# Patient Record
Sex: Female | Born: 1980 | Race: White | Hispanic: No | Marital: Married | Smoking: Never smoker
Health system: Southern US, Community
[De-identification: ages and names within clinical notes are randomized; demographics above are authoritative.]

## PROBLEM LIST (undated history)

## (undated) DIAGNOSIS — J329 Chronic sinusitis, unspecified: Secondary | ICD-10-CM

## (undated) DIAGNOSIS — H519 Unspecified disorder of binocular movement: Secondary | ICD-10-CM

## (undated) DIAGNOSIS — R87629 Unspecified abnormal cytological findings in specimens from vagina: Secondary | ICD-10-CM

## (undated) DIAGNOSIS — N946 Dysmenorrhea, unspecified: Secondary | ICD-10-CM

## (undated) DIAGNOSIS — F329 Major depressive disorder, single episode, unspecified: Secondary | ICD-10-CM

## (undated) DIAGNOSIS — K219 Gastro-esophageal reflux disease without esophagitis: Secondary | ICD-10-CM

## (undated) DIAGNOSIS — J302 Other seasonal allergic rhinitis: Secondary | ICD-10-CM

## (undated) DIAGNOSIS — IMO0002 Reserved for concepts with insufficient information to code with codable children: Secondary | ICD-10-CM

## (undated) DIAGNOSIS — F32A Depression, unspecified: Secondary | ICD-10-CM

## (undated) DIAGNOSIS — F419 Anxiety disorder, unspecified: Secondary | ICD-10-CM

## (undated) DIAGNOSIS — R569 Unspecified convulsions: Secondary | ICD-10-CM

## (undated) DIAGNOSIS — Z9049 Acquired absence of other specified parts of digestive tract: Secondary | ICD-10-CM

## (undated) DIAGNOSIS — R87619 Unspecified abnormal cytological findings in specimens from cervix uteri: Secondary | ICD-10-CM

## (undated) DIAGNOSIS — B977 Papillomavirus as the cause of diseases classified elsewhere: Secondary | ICD-10-CM

## (undated) DIAGNOSIS — R112 Nausea with vomiting, unspecified: Secondary | ICD-10-CM

## (undated) DIAGNOSIS — R87610 Atypical squamous cells of undetermined significance on cytologic smear of cervix (ASC-US): Secondary | ICD-10-CM

## (undated) DIAGNOSIS — A630 Anogenital (venereal) warts: Secondary | ICD-10-CM

## (undated) DIAGNOSIS — Z9889 Other specified postprocedural states: Secondary | ICD-10-CM

## (undated) DIAGNOSIS — N920 Excessive and frequent menstruation with regular cycle: Secondary | ICD-10-CM

## (undated) DIAGNOSIS — K8020 Calculus of gallbladder without cholecystitis without obstruction: Secondary | ICD-10-CM

## (undated) DIAGNOSIS — L0232 Furuncle of buttock: Principal | ICD-10-CM

## (undated) HISTORY — DX: Unspecified abnormal cytological findings in specimens from vagina: R87.629

## (undated) HISTORY — DX: Dysmenorrhea, unspecified: N94.6

## (undated) HISTORY — DX: Unspecified abnormal cytological findings in specimens from cervix uteri: R87.619

## (undated) HISTORY — PX: LEEP: SHX91

## (undated) HISTORY — DX: Papillomavirus as the cause of diseases classified elsewhere: B97.7

## (undated) HISTORY — DX: Calculus of gallbladder without cholecystitis without obstruction: K80.20

## (undated) HISTORY — PX: EYE MUSCLE SURGERY: SHX370

## (undated) HISTORY — DX: Excessive and frequent menstruation with regular cycle: N92.0

## (undated) HISTORY — DX: Other seasonal allergic rhinitis: J30.2

## (undated) HISTORY — DX: Reserved for concepts with insufficient information to code with codable children: IMO0002

## (undated) HISTORY — DX: Chronic sinusitis, unspecified: J32.9

## (undated) HISTORY — DX: Atypical squamous cells of undetermined significance on cytologic smear of cervix (ASC-US): R87.610

## (undated) HISTORY — PX: TYMPANOPLASTY: SHX33

## (undated) HISTORY — DX: Furuncle of buttock: L02.32

---

## 1998-07-24 ENCOUNTER — Other Ambulatory Visit: Admission: RE | Admit: 1998-07-24 | Discharge: 1998-07-24 | Payer: Self-pay | Admitting: Obstetrics and Gynecology

## 1999-08-10 HISTORY — PX: LEEP: SHX91

## 1999-11-23 ENCOUNTER — Other Ambulatory Visit: Admission: RE | Admit: 1999-11-23 | Discharge: 1999-11-23 | Payer: Self-pay | Admitting: Obstetrics and Gynecology

## 2000-02-18 ENCOUNTER — Other Ambulatory Visit: Admission: RE | Admit: 2000-02-18 | Discharge: 2000-02-18 | Payer: Self-pay | Admitting: Obstetrics and Gynecology

## 2000-04-12 ENCOUNTER — Other Ambulatory Visit: Admission: RE | Admit: 2000-04-12 | Discharge: 2000-04-12 | Payer: Self-pay | Admitting: Obstetrics and Gynecology

## 2000-04-12 ENCOUNTER — Encounter (INDEPENDENT_AMBULATORY_CARE_PROVIDER_SITE_OTHER): Payer: Self-pay | Admitting: Specialist

## 2000-06-27 ENCOUNTER — Other Ambulatory Visit: Admission: RE | Admit: 2000-06-27 | Discharge: 2000-06-27 | Payer: Self-pay | Admitting: Obstetrics and Gynecology

## 2000-06-27 ENCOUNTER — Encounter (INDEPENDENT_AMBULATORY_CARE_PROVIDER_SITE_OTHER): Payer: Self-pay | Admitting: Specialist

## 2001-10-24 ENCOUNTER — Emergency Department (HOSPITAL_COMMUNITY): Admission: EM | Admit: 2001-10-24 | Discharge: 2001-10-24 | Payer: Self-pay | Admitting: *Deleted

## 2007-06-02 ENCOUNTER — Other Ambulatory Visit: Admission: RE | Admit: 2007-06-02 | Discharge: 2007-06-02 | Payer: Self-pay | Admitting: Obstetrics & Gynecology

## 2007-12-31 ENCOUNTER — Inpatient Hospital Stay (HOSPITAL_COMMUNITY): Admission: AD | Admit: 2007-12-31 | Discharge: 2008-01-03 | Payer: Self-pay | Admitting: Obstetrics and Gynecology

## 2009-08-09 HISTORY — PX: NASAL SEPTUM SURGERY: SHX37

## 2010-04-15 ENCOUNTER — Other Ambulatory Visit
Admission: RE | Admit: 2010-04-15 | Discharge: 2010-04-15 | Payer: Self-pay | Source: Home / Self Care | Admitting: Obstetrics and Gynecology

## 2010-05-25 LAB — HIV ANTIBODY (ROUTINE TESTING W REFLEX): HIV: NONREACTIVE

## 2010-05-25 LAB — RPR: RPR: NONREACTIVE

## 2010-08-09 DIAGNOSIS — Z9049 Acquired absence of other specified parts of digestive tract: Secondary | ICD-10-CM

## 2010-08-09 HISTORY — DX: Acquired absence of other specified parts of digestive tract: Z90.49

## 2010-08-09 HISTORY — PX: CHOLECYSTECTOMY: SHX55

## 2010-08-09 NOTE — L&D Delivery Note (Signed)
Delivery Note At 8:12 PM a viable female was delivered via Vaginal, Spontaneous Delivery (Presentation: Right Occiput Anterior).  APGAR: 8, 9; weight 9 lb 11 oz (4394 g).   Placenta status: Intact, Spontaneous.  Cord: 3 vessels with the following complications: None.  Cord blood donation sent. Uterine massage with good homeostasis.   Anesthesia: Epidural  Episiotomy: None Lacerations: 2nd degree;Perineal Suture Repair: 3.0 vicryl Est. Blood Loss (mL): 300  Mom to postpartum.  Baby to nursery-stable.  Deborah Barr 03/01/2011, 8:47 PM

## 2010-08-31 LAB — CBC
HCT: 35.4 % — ABNORMAL LOW (ref 36.0–46.0)
MCHC: 34.7 g/dL (ref 30.0–36.0)
MCV: 84.3 fL (ref 78.0–100.0)
RDW: 13.2 % (ref 11.5–15.5)
WBC: 10.3 10*3/uL (ref 4.0–10.5)

## 2010-08-31 LAB — BASIC METABOLIC PANEL
BUN: 6 mg/dL (ref 6–23)
GFR calc non Af Amer: >60 mL/min (ref >60)
Glucose, Bld: 80 mg/dL (ref 70–99)
Potassium: 4.3 mEq/L (ref 3.5–5.1)

## 2010-09-04 ENCOUNTER — Ambulatory Visit (HOSPITAL_COMMUNITY)
Admission: RE | Admit: 2010-09-04 | Discharge: 2010-09-05 | Payer: Self-pay | Source: Home / Self Care | Attending: General Surgery | Admitting: General Surgery

## 2010-09-04 HISTORY — PX: LAPAROSCOPIC CHOLECYSTECTOMY: SUR755

## 2010-09-08 NOTE — Discharge Summary (Signed)
  NAME:  Deborah Barr, Deborah Barr NO.:  [id number]  MEDICAL RECORD NO.:  [id number]          PATIENT TYPE:  OIB  LOCATION:  A331                          FACILITY:  [facility]  PHYSICIAN:  Dalia Heading, M.D.  DATE OF BIRTH:  11-20-1980  DATE OF ADMISSION:  09/04/2010 DATE OF DISCHARGE:  01/28/2012LH                              DISCHARGE SUMMARY   HOSPITAL COURSE SUMMARY:  The patient is a 30 year old pregnant white female who was referred by Dr. Despina Hidden to Dr. Leticia Penna for laparoscopic cholecystectomy.  She is approximately 14 or 15 weeks into her pregnancy.  She was taken to the operating room on September 04, 2010, and underwent laparoscopic cholecystectomy.  Postoperatively, she did have some vaginal bleeding.  An ultrasound of the uterus did reveal a viable intrauterine pregnancy without evidence of miscarriage.  She had frequent fetal heart tone monitorings which remained stable.  Dr. Emelda Fear was consulted and he suggested bedrest and observation.  Her vaginal bleeding subsequently resolved and her fetal heart tones on the day of discharge are at 155.  She is being discharged home on postoperative day #1 in good improving condition.  DISCHARGE INSTRUCTIONS:  The patient is to follow up Dr. Leticia Penna on September 10, 2010, Dr. Despina Hidden as previously scheduled.  DISCHARGE MEDICATIONS: 1. Percocet 1-2 tablets p.o. q.4 h. p.r.n. pain. 2. DHA supplement by mouth daily. 3. Zofran 4 mg p.o. q.8 h. p.r.n. 4. Prenatal vitamin 1 tablet p.o. daily.  PRINCIPAL DIAGNOSES: 1. Cholecystitis, cholelithiasis. 2. Intrauterine pregnancy  PRINCIPAL PROCEDURE:  Laparoscopic cholecystectomy on September 04, 2010.     Dalia Heading, M.D.     MAJ/MEDQ  D:  09/05/2010  T:  09/06/2010  Job:  [id number]  cc:   Tilford Pillar, MD Fax: [phone]  Lazaro Arms, M.D. Fax: [phone]  Electronically Signed by Franky Macho M.D. on 09/08/2010 09:16:47 AM

## 2010-09-23 NOTE — Op Note (Signed)
NAMESHAYLEY, MEDLIN NO.:  [id number]  MEDICAL RECORD NO.:  [id number]          PATIENT TYPE:  OIB  LOCATION:  A331                          FACILITY:  [facility]  PHYSICIAN:  Tilford Pillar, MD      DATE OF BIRTH:  [birth year]  DATE OF PROCEDURE:  09/04/2010 DATE OF DISCHARGE:                              OPERATIVE REPORT   PREOPERATIVE DIAGNOSIS:  Cholelithiasis.  POSTOPERATIVE DIAGNOSIS:  Cholelithiasis.  PROCEDURE:  Laparoscopic cholecystectomy.  SURGEON:  Tilford Pillar, MD  ANESTHESIA:  General endotracheal local anesthetic with 0.5% Sensorcaine plain.  SPECIMEN:  Gallbladder.  ESTIMATED BLOOD LOSS:  Minimal.  INDICATIONS:  The patient is a 30 year old female who presented to my office on referral by her OB/GYN, Dr. Despina Hidden, for evaluation of cholelithiasis and biliary disease, at this time, she was 29 weeks' pregnant, currently she is 14 and 1/2 weeks intrauterine pregnancy.  The risks, benefits, and alternatives of laparoscopic possible open cholecystectomy were discussed at length with the patient including but not limited to risk of bleeding, infection, bile leak, small bowel injury, bile duct injury as well as the possibility of intraoperative cardiac and pulmonary events as well as possibility of pregnancy loss, although extremely rare.  Her questions and concerns were addressed. The patient was consented for planned procedure.  OPERATION:  The patient was taken to the operating room and was placed in the supine position on the operating room table, at which time general anesthetic was administered.  Once the patient was asleep, she was endotracheally intubated by Anesthesia.  At this point, her abdomen was prepped with DuraPrep solution and draped in standard fashion.  A stab incision was created supraumbilically with 11-blade scalpel. Additional dissection down through the subcuticular tissue was carried out using a Kocher clamp, which was  utilized to grasp the anterior abdominal wall fascia and palpation of the abdominal wall was carried out and ensured that the uterus was well above the umbilicus which was then palpated.  Uterus was palpated at the upper aspect of the suprapubic region.  At this point, a Veress needle was carefully inserted.  Saline drop test utilized to confirm intraperitoneal placement and then pneumoperitoneum was obtained.  Once sufficient pneumoperitoneum was obtained, an 11-mm trocar was inserted over laparoscope allowing visualization of trocar entering into peritoneal cavity.  At this time, the inner cannula was removed.  The laparoscope was reinserted.  No evidence of any trocar or Veress needle placement injury.  At this time, the remaining trocars were placed.  A 11-mm trocar was placed in the epigastrium, a 5-mm trocar was placed in the midline between the two 11-mm trocars and the 5-mm trocars placed in the right lateral abdominal wall.  the patient was placed in a reverse Trendelenburg left lateral decubitus position.  The fundus of the gallbladder was grasped and lifted up and over the right lobe of the liver.  A blunt dissection was carried out with a [place] dissector and the  infundibulum exposing both the cystic duct and cystic artery, as they entered into the infundibulum.  A window was noted behind both these structures and clips were  placed proximally on both sides and distally and the cystic duct was divided between the two most distal clips.  Similarly, the cystic artery.  At this time, electrocautery was utilized to dissect the gallbladder free from the gallbladder fossa.  A small cholecystotomy was created.  A small amount of bile spillage at time, it grasped with a regular graspers to control spillage.  We dissected off the gallbladder fossa, placed in EndoCatch bag which placed up over the right lobe liver.  Suction irrigator was brought to the field.  The surgical site was  copiously irrigated, returning aspirate was clear, endoscope was inspected.  There was no evidence of any bleeding or bile leak.  At this time, I did inspect the remainder of the abdomen and again the uterus was inspected and noted to be clearly gravid and unremarkable.  At this time I did turn attention to closure.  Using an EndoClose suture passing device a 2-0 Vicryl suture was passed through both the 11-mm trocar sites.  With these sutures placed in place, the pneumoperitoneum evacuated.  Trocars were removed.  The local anesthetic instilled, Vicryl sutures secured, t4-0 Monocryl was utilized to reapproximate skin edges at all trocar sites and the skin was washed and dried with moist dry towel.  Benzoin was applied around the incision.  Half-inch Steri-Strips were placed.  The drapes were removed. The patient was allowed to come out of the general anesthetic and was transferred back to regular hospital bed in stable condition.  At the conclusion of procedure, all instruments, sponge, and needle counts were correct.  The patient tolerated the procedure well.     Tilford Pillar, MD     BZ/MEDQ  D:  09/04/2010  T:  09/05/2010  Job:  [id number]  Electronically Signed by Tilford Pillar MD on 09/23/2010 11:09:55 AM

## 2010-09-23 NOTE — H&P (Signed)
NAME:  DANN, VENTRESS NO.:  [id number]  MEDICAL RECORD NO.:  [id number]          PATIENT TYPE:  AMB  LOCATION:  DAY                           FACILITY:  [facility]  PHYSICIAN:  Tilford Pillar, MD      DATE OF BIRTH:  October 15, 1980  DATE OF ADMISSION: DATE OF DISCHARGE:  LH                             HISTORY & PHYSICAL   CHIEF COMPLAINT:  Gallstones.  HISTORY OF PRESENT ILLNESS:  The patient is a 30 year old female with a history of epigastric and right upper quadrant abdominal pain.  This started after Thanksgiving of this year.  She has had a couple of episodes over the Christmas holiday and states that the pain used to occasionally come on with drinking water.  She has not noticed any change with fatty or greasy food intake.  No history of jaundice. Occasional nausea.  No emesis.  No change in bowel movements.  No melena, no hematochezia.  Unfortunately, the patient's symptomatology has progressed with some mild exacerbations of her discomfort.  She is currently in her 13th week of her pregnancy and has been evaluated and seen by her OB/GYN, Dr. Despina Hidden, who has recommended proceeding with laparoscopic cholecystectomy in her 14th or 15th week of her pregnancy.  PAST MEDICAL HISTORY:  None.  PAST SURGICAL HISTORY:  Previous eye surgery and sinus surgery.  MEDICATIONS:  Pravastatin and Zofran.  ALLERGIES:  Codeine.  SOCIAL HISTORY:  No tobacco, no alcohol use.  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  She has had three prior pregnancies, this is her fourth.  FAMILY HISTORY:  Does have a history of biliary disease.  REVIEW OF SYSTEMS:  CONSTITUTIONAL:  Unremarkable.  EYES:  Unremarkable. EARS, NOSE, AND THROAT:  Occasional rhinorrhea.  RESPIRATORY: Unremarkable.  CARDIOVASCULAR:  Unremarkable.  GASTROINTESTINAL: Abdominal pain, nausea, and indigestion as per HPI.  GENITOURINARY: Increased frequency suspected to be likely related to  pregnancy. MUSCULOSKELETAL:  Unremarkable.  SKIN:  Dry.  ENDOCRINE:  Unremarkable. NEUROLOGIC:  Unremarkable.  PHYSICAL EXAMINATION:  GENERAL:  The patient is a healthy, mildly to moderately obese female.  She is calm.  She is not in acute distress. She is alert and oriented x3. HEENT:  Scalp, no deformities or masses.  Eyes, pupils equal, round, reactive.  Extraocular movement are intact.  No scleral icterus, conjunctival pallor.  Oral mucosa pink, normal occlusion. NECK:  Trachea is midline.  No thyroid nodularity is noted.  No cervical lymphadenopathy. PULMONARY:  Unlabored respirations.  No wheezes.  No crackles.  She is clear to auscultation bilaterally. CARDIOVASCULAR:  Regular rate and rhythm.  No murmurs or gallops.  She has 2+ radial and femoral pulses bilaterally. ABDOMEN:  Positive bowel sounds.  Abdomen is soft and nontender.  I am not able to palpate the fundus of the uterus at this point due to the early course of her pregnancy. SKIN:  Warm and dry.  PERTINENT LABORATORY AND RADIOGRAPHIC STUDIES:  Right upper quadrant ultrasound did demonstrate stones within the gallbladder.  ASSESSMENT AND PLAN:  Cholelithiasis.  At this time, I had an extremely long conversation with the patient regarding the findings  of the possibility of an acute exacerbation of her symptomatology during the pregnancy and elective surgery versus continued monitoring.  I also discussed risks, benefits, and alternatives of possible laparoscopic surgery at the time of her pregnancy, although extremely remote, she is aware that there is a possibility of loss of pregnancy secondary to a surgical intervention.  However, she is also aware that due to her several exacerbations of her cholelithiasis that additional exacerbations are extremely likely during the course of her pregnancy. As recommended by her OB/GYN, she does understand and does wish to proceed with the surgical intervention again in the 14th  or 15th week of her pregnancy.  We will plan to proceed.     Tilford Pillar, MD     BZ/MEDQ  D:  09/03/2010  T:  09/03/2010  Job:  [id number]  Electronically Signed by Tilford Pillar MD on 09/23/2010 11:09:52 AM

## 2010-11-15 DIAGNOSIS — Z9049 Acquired absence of other specified parts of digestive tract: Secondary | ICD-10-CM

## 2010-11-16 ENCOUNTER — Encounter: Payer: Self-pay | Admitting: Obstetrics & Gynecology

## 2011-03-01 ENCOUNTER — Encounter (HOSPITAL_COMMUNITY): Payer: Self-pay | Admitting: Anesthesiology

## 2011-03-01 ENCOUNTER — Inpatient Hospital Stay (HOSPITAL_COMMUNITY): Payer: BC Managed Care – PPO | Admitting: Anesthesiology

## 2011-03-01 ENCOUNTER — Inpatient Hospital Stay (HOSPITAL_COMMUNITY)
Admission: AD | Admit: 2011-03-01 | Discharge: 2011-03-03 | DRG: 373 | Disposition: A | Discharge status: Home / Self Care | Payer: BC Managed Care – PPO | Source: Ambulatory Visit | Attending: Obstetrics & Gynecology | Admitting: Obstetrics & Gynecology

## 2011-03-01 ENCOUNTER — Encounter (HOSPITAL_COMMUNITY): Payer: Self-pay

## 2011-03-01 DIAGNOSIS — IMO0001 Reserved for inherently not codable concepts without codable children: Secondary | ICD-10-CM

## 2011-03-01 HISTORY — DX: Acquired absence of other specified parts of digestive tract: Z90.49

## 2011-03-01 HISTORY — DX: Anogenital (venereal) warts: A63.0

## 2011-03-01 HISTORY — DX: Unspecified disorder of binocular movement: H51.9

## 2011-03-01 LAB — CBC
MCH: 29.1 pg (ref 26.0–34.0)
MCHC: 33.2 g/dL (ref 30.0–36.0)
Platelets: 152 10*3/uL (ref 150–400)

## 2011-03-01 MED ORDER — IBUPROFEN 600 MG PO TABS
600.0000 mg | ORAL_TABLET | Freq: Four times a day (QID) | ORAL | Status: DC
  Administered 2011-03-02 – 2011-03-03 (×5): 600 mg via ORAL
  Filled 2011-03-01 (×5): qty 1

## 2011-03-01 MED ORDER — ONDANSETRON HCL 4 MG PO TABS: 4.0000 mg | ORAL_TABLET | ORAL | Status: DC | PRN

## 2011-03-01 MED ORDER — DIPHENHYDRAMINE HCL 25 MG PO CAPS: 25.0000 mg | ORAL_CAPSULE | Freq: Four times a day (QID) | ORAL | Status: DC | PRN

## 2011-03-01 MED ORDER — ONDANSETRON HCL 4 MG/2ML IJ SOLN: 4.0000 mg | INTRAMUSCULAR | Status: DC | PRN

## 2011-03-01 MED ORDER — PRENATAL PLUS 27-1 MG PO TABS
1.0000 | ORAL_TABLET | Freq: Every day | ORAL | Status: DC
  Administered 2011-03-02 – 2011-03-03 (×2): 1 via ORAL
  Filled 2011-03-01 (×2): qty 1

## 2011-03-01 MED ORDER — NALBUPHINE SYRINGE 5 MG/0.5 ML
10.0000 mg | INJECTION | Freq: Once | INTRAMUSCULAR | Status: DC
  Filled 2011-03-01: qty 1

## 2011-03-01 MED ORDER — TRAMADOL HCL 50 MG PO TABS
50.0000 mg | ORAL_TABLET | ORAL | Status: DC | PRN
  Filled 2011-03-01: qty 1

## 2011-03-01 MED ORDER — WITCH HAZEL-GLYCERIN EX PADS: MEDICATED_PAD | CUTANEOUS | Status: DC | PRN

## 2011-03-01 MED ORDER — LACTATED RINGERS IV SOLN
500.0000 mL | Freq: Once | INTRAVENOUS | Status: DC
Start: 2011-03-01 — End: 2011-03-01

## 2011-03-01 MED ORDER — SIMETHICONE 80 MG PO CHEW: 80.0000 mg | CHEWABLE_TABLET | ORAL | Status: DC | PRN

## 2011-03-01 MED ORDER — FENTANYL CITRATE 0.05 MG/ML IJ SOLN: 100.0000 ug | INTRAMUSCULAR | Status: DC | PRN

## 2011-03-01 MED ORDER — NALBUPHINE SYRINGE 5 MG/0.5 ML
5.0000 mg | INJECTION | INTRAMUSCULAR | Status: DC | PRN
  Filled 2011-03-01: qty 0.5

## 2011-03-01 MED ORDER — ACETAMINOPHEN 325 MG PO TABS
650.0000 mg | ORAL_TABLET | ORAL | Status: DC | PRN
Start: 2011-03-01 — End: 2011-03-01

## 2011-03-01 MED ORDER — DIPHENHYDRAMINE HCL 50 MG/ML IJ SOLN: 12.5000 mg | INTRAMUSCULAR | Status: DC | PRN

## 2011-03-01 MED ORDER — ZOLPIDEM TARTRATE 5 MG PO TABS: 5.0000 mg | ORAL_TABLET | Freq: Every evening | ORAL | Status: DC | PRN

## 2011-03-01 MED ORDER — EPHEDRINE 5 MG/ML INJ
10.0000 mg | INTRAVENOUS | Status: DC | PRN
  Filled 2011-03-01 (×2): qty 4

## 2011-03-01 MED ORDER — LACTATED RINGERS IV SOLN: INTRAVENOUS | Status: DC

## 2011-03-01 MED ORDER — LANOLIN HYDROUS EX OINT: TOPICAL_OINTMENT | CUTANEOUS | Status: DC | PRN

## 2011-03-01 MED ORDER — ONDANSETRON HCL 4 MG/2ML IJ SOLN: 4.0000 mg | Freq: Four times a day (QID) | INTRAMUSCULAR | Status: DC | PRN

## 2011-03-01 MED ORDER — IBUPROFEN 600 MG PO TABS
600.0000 mg | ORAL_TABLET | Freq: Four times a day (QID) | ORAL | Status: DC | PRN
Start: 2011-03-01 — End: 2011-03-01

## 2011-03-01 MED ORDER — PHENYLEPHRINE 40 MCG/ML (10ML) SYRINGE FOR IV PUSH (FOR BLOOD PRESSURE SUPPORT)
80.0000 ug | PREFILLED_SYRINGE | INTRAVENOUS | Status: DC | PRN
  Filled 2011-03-01 (×2): qty 5

## 2011-03-01 MED ORDER — EPHEDRINE 5 MG/ML INJ
10.0000 mg | INTRAVENOUS | Status: DC | PRN
  Filled 2011-03-01: qty 4

## 2011-03-01 MED ORDER — TETANUS-DIPHTH-ACELL PERTUSSIS 5-2.5-18.5 LF-MCG/0.5 IM SUSP: 0.5000 mL | Freq: Once | INTRAMUSCULAR | Status: DC

## 2011-03-01 MED ORDER — PHENYLEPHRINE 40 MCG/ML (10ML) SYRINGE FOR IV PUSH (FOR BLOOD PRESSURE SUPPORT)
80.0000 ug | PREFILLED_SYRINGE | INTRAVENOUS | Status: DC | PRN
  Filled 2011-03-01: qty 5

## 2011-03-01 MED ORDER — LIDOCAINE HCL (PF) 1 % IJ SOLN
30.0000 mL | INTRAMUSCULAR | Status: DC | PRN
  Administered 2011-03-01: 30 mL via SUBCUTANEOUS
  Filled 2011-03-01: qty 30

## 2011-03-01 MED ORDER — OXYTOCIN 20 UNITS IN LACTATED RINGERS INFUSION - SIMPLE
125.0000 mL/h | Freq: Once | INTRAVENOUS | Status: AC
  Administered 2011-03-01: 125 mL/h via INTRAVENOUS
  Filled 2011-03-01: qty 1000

## 2011-03-01 MED ORDER — LACTATED RINGERS IV SOLN: 500.0000 mL | INTRAVENOUS | Status: DC | PRN

## 2011-03-01 MED ORDER — FLEET ENEMA 7-19 GM/118ML RE ENEM
1.0000 | ENEMA | RECTAL | Status: DC | PRN
Start: 2011-03-01 — End: 2011-03-01

## 2011-03-01 MED ORDER — HYDROCORTISONE ACE-PRAMOXINE 1-1 % RE FOAM: 1.0000 | Freq: Two times a day (BID) | RECTAL | Status: DC

## 2011-03-01 MED ORDER — LIDOCAINE HCL 1.5 % IJ SOLN
INTRAMUSCULAR | Status: DC | PRN
  Administered 2011-03-01: 2 mL
  Administered 2011-03-01 (×2): 5 mL

## 2011-03-01 MED ORDER — BENZOCAINE-MENTHOL 20-0.5 % EX AERO: 1.0000 "application " | INHALATION_SPRAY | CUTANEOUS | Status: DC | PRN

## 2011-03-01 MED ORDER — FENTANYL 2.5 MCG/ML BUPIVACAINE 1/10 % EPIDURAL INFUSION (WH - ANES)
14.0000 mL/h | INTRAMUSCULAR | Status: DC
  Administered 2011-03-01: 14 mL/h via EPIDURAL
  Filled 2011-03-01: qty 60

## 2011-03-01 MED ORDER — SENNOSIDES-DOCUSATE SODIUM 8.6-50 MG PO TABS
1.0000 | ORAL_TABLET | Freq: Every day | ORAL | Status: DC
  Administered 2011-03-02: 1 via ORAL

## 2011-03-01 MED ORDER — TRAMADOL HCL 50 MG PO TABS: 50.0000 mg | ORAL_TABLET | ORAL | Status: DC | PRN

## 2011-03-01 MED ORDER — CITRIC ACID-SODIUM CITRATE 334-500 MG/5ML PO SOLN: 30.0000 mL | ORAL | Status: DC | PRN

## 2011-03-01 NOTE — Progress Notes (Signed)
PT WALKING UNTIL 1700

## 2011-03-01 NOTE — Progress Notes (Signed)
Pt states ctx's q38minutes apart since 0600 this am, denies bleding or lof, +FM.

## 2011-03-01 NOTE — ED Provider Notes (Signed)
     History   Chief Complaint:  Contractions  Deborah Barr is  31 y.o. G3P1011 Patient's last menstrual period was 05/25/2010. EDD is 03/01/11. 40 wga. She reports contractions that started at 6am today. She reports having them every . They then increased in frequency and intensity. She denies any vaginal bleeding, any leakage of fluid or discharge. Her pregnancy has been complicated by cholecystitis for which she had a lap cholecystectomy at 16wga. She denies any HTN or DM  Deborah Barr is  30 y.o. G3P1011 Patient's last menstrual period was 05/25/2010.Marland Kitchen  General ROS:  negative except per HPI    Physical Exam   Blood pressure 100/58, pulse 81, temperature 98 F (36.7 C), temperature source Oral, resp. rate 16, height 5\' 2"  (1.575 m), weight 209 lb 4 oz (94.915 kg), last menstrual period 05/25/2010.  General exam: Gen: A+Ox3, in discomfort during contractions. CV: RRR, no murmurs, rubs or gallops Lungs: CTA b/l Abdomen: gravid Ext: no edema Focused Gynecological Exam: SVE: 1.5/70/-2 SVE 1.5hr later: 6.5/90/-1 NST: reactive. FHR: 140s, moderate variability, present accelerations, no decels.   Labs: No results found for this or any previous visit (from the past 24 hour(s)).  Ultrasound Studies: (NOTE: Ultrasound reports are not yet able to be directly imported into notes.  Please "CUT" this portion of the note and either free text the report or "COPY/PASTE" from the report viewer.)  Assessment: Evaluation for labor.    Plan: 1. Will check pt after an hour of walking to assess any cervical change.  2. Pt in active labor. Admit to [facility]   Deborah Barr 03/01/2011, 4:00 PM

## 2011-03-01 NOTE — Progress Notes (Addendum)
  Deborah Barr is a 30 y.o. G3P1011 at 40wga who presents in active labor.  Subjective: Pt received epidural. AROM with light meconium. Pt comfortable.   Objective: BP 113/68  Pulse 96  Temp(Src) 98 F (36.7 C) (Oral)  Resp 16  Ht 5\' 2"  (1.575 m)  Wt 209 lb (94.802 kg)  BMI 38.23 kg/m2  SpO2 100%  LMP 05/25/2010      FHT:  FHR: 145 bpm, variability: moderate,  accelerations:  Present,  decelerations:  Absent UC:   regular, every 2 minutes SVE:   Dilation: Lip/rim Effacement (%): 100 Station: +1 Exam by:: S.Maansi Wike  Labs: Lab Results  Component Value Date   WBC 15.7* 03/01/2011   HGB 12.9 03/01/2011   HCT 38.9 03/01/2011   MCV 87.6 03/01/2011   PLT 152 03/01/2011    Assessment / Plan: Spontaneous labor, progressing normally, s/p AROM light meconium stained.  Labor: Progressing normally Preeclampsia:  na Fetal Wellbeing:  Category I Pain Control:  Epidural I/D:  GBS neg Anticipated MOD:  NSVD  Deborah Barr 03/01/2011, 7:18 PM

## 2011-03-01 NOTE — Anesthesia Preprocedure Evaluation (Signed)
Anesthesia Evaluation  Name, MR# and DOB Patient awake  General Assessment Comment  Reviewed: Allergy & Precautions, H&P  and Patient's Chart, lab work & pertinent test results  History of Anesthesia Complications Negative for: history of anesthetic complications  Airway Mallampati: I TM Distance: >3 FB     Dental   Pulmonaryneg pulmonary ROS    clear to auscultation    Cardiovascular regular Normal   Neuro/PsychNegative Neurological ROS Negative Psych ROS  GI/Hepatic/Renal negative GI ROS, negative Liver ROS, and negative Renal ROS (+)       Endo/Other   (+)  Morbid obesity Abdominal   Musculoskeletal  Hematology negative hematology ROS (+)   Peds  Reproductive/Obstetrics (+) Pregnancy   Anesthesia Other Findings             Anesthesia Physical Anesthesia Plan  ASA: II  Anesthesia Plan: Epidural   Post-op Pain Management:    Induction:   Airway Management Planned:   Additional Equipment:   Intra-op Plan:   Post-operative Plan:   Informed Consent: I have reviewed the patients History and Physical, chart, labs and discussed the procedure including the risks, benefits and alternatives for the proposed anesthesia with the patient or authorized representative who has indicated his/her understanding and acceptance.     Plan Discussed with:   Anesthesia Plan Comments:         Anesthesia Quick Evaluation

## 2011-03-01 NOTE — H&P (Signed)
     Subjective:  Deborah Barr is a 30 y.o. G2P1011 female with EDC 03/01/11 at 40/[redacted] weeks gestation who is being admitted for active labor.  Her current obstetrical history is significant for {cholecystectomy for cholecystitis at 16wga.  Patient reports increased contractions since 6am today, every .   Fetal Movement: normal.     Objective:  Pt in pain with contractions,  Vital signs in last 24 hours: Temp:  [98 F (36.7 C)] 98 F (36.7 C) (07/23 1429) Pulse Rate:  [81] 81  (07/23 1429) Resp:  [16] 16  (07/23 1429) BP: (100)/(58) 100/58 mmHg (07/23 1429) Weight:  [209 lb 4 oz (94.915 kg)] 209 lb 4 oz (94.915 kg) (07/23 1429)   General:   alert, cooperative and in pain during contractions  Skin:   normal  HEENT:  PERRLA and extra ocular movement intact  Lungs:   clear to auscultation bilaterally  Heart:   regular rate and rhythm, S1, S2 normal, no murmur, click, rub or gallop  Breasts:   deferred  Abdomen:  gravid   Pelvis:  Exam deferred.  FHT:  140 BPM  Uterine Size: deferred  Presentations: cephalic  Cervix:    Dilation: 6.5   Effacement: 90   Station:  -1   Consistency: soft   Position: middle  Toco: q43min FHR: 140s, moderate variability, present accels, no decels.  Lab Review O-, Rh negative, RPR neg, HSV neg, HIV neg, GC/Chl neg, Rubella immune, HbsAg neg. Received Rhogam on 12/17/2010  AFP:wnl  three hour GTT: 78/115/126  Assessment/Plan:  {40/[redacted] weeks gestation. Active labor Pain management: fentanyl and nubain and epidural per request GBS neg Fetal wellbeing: category 1 Continue expectant management.  Obstetrical history significant for cholecystectomy at 16wga.     Risks, benefits, alternatives and possible complications have been discussed in detail with the patient.  Pre-admission, admission, and post admission procedures and expectations were discussed in detail.  All questions answered, all appropriate consents will be signed at the Hospital.  Admission is planned for today.  active labor.

## 2011-03-01 NOTE — Progress Notes (Signed)
Plan of care discussed with pt at this time.  Pt's questions answered in full and to her verbalized understanding.  Pt verbalizes understanding of and agreement with her care plan.  FHT reviewed in its entirety by Dr Natale Milch  at this time.

## 2011-03-01 NOTE — Progress Notes (Signed)
  Deborah Barr is a 30 y.o. G3P1011 at 40wga Subjective: Pt feeling some pressure. Comfortable with epidural  Objective: BP 114/69  Pulse 90  Temp(Src) 98 F (36.7 C) (Oral)  Resp 16  Ht 5\' 2"  (1.575 m)  Wt 209 lb (94.802 kg)  BMI 38.23 kg/m2  SpO2 100%  LMP 05/25/2010      FHT:  FHR: 145 bpm, variability: moderate,  accelerations:  Present,  decelerations:  Absent UC:   regular, every 2 minutes SVE:   Dilation: Lip/rim Effacement (%): 100 Station: +1 Exam by:: S.Hercules Hasler  Labs: Lab Results  Component Value Date   WBC 15.7* 03/01/2011   HGB 12.9 03/01/2011   HCT 38.9 03/01/2011   MCV 87.6 03/01/2011   PLT 152 03/01/2011    Assessment / Plan: Spontaneous labor, progressing normally  Labor: Progressing normally Preeclampsia:  na Fetal Wellbeing:  Category I Pain Control:  Epidural I/D:  gbs neg Anticipated MOD:  NSVD  Kambre Messner 03/01/2011, 7:57 PM

## 2011-03-01 NOTE — Anesthesia Procedure Notes (Signed)
Epidural Patient location during procedure: OB Start time: 03/01/2011 6:40 PM Reason for block: procedure for pain  Staffing Anesthesiologist: Maximilliano Kersh L. Performed by: anesthesiologist   Preanesthetic Checklist Completed: patient identified, site marked, surgical consent, pre-op evaluation, timeout performed, IV checked, risks and benefits discussed and monitors and equipment checked  Epidural Patient position: sitting Prep: site prepped and draped and DuraPrep Patient monitoring: continuous pulse ox, blood pressure and heart rate Approach: midline Injection technique: LOR air  Needle:  Needle type: Tuohy  Needle gauge: 17 G Needle length: 9 cm Needle insertion depth: 5 cm cm Catheter type: closed end flexible Catheter size: 19 Gauge Catheter at skin depth: 10 cm Test dose: negative  Assessment Events: blood not aspirated, injection not painful, no injection resistance, negative IV test and no paresthesia  Additional Notes Discussed risk of headache, infection, bleeding, nerve injury and failed or incomplete block.  Patient voices understanding and wishes to proceed.

## 2011-03-02 LAB — RPR: RPR Ser Ql: NONREACTIVE

## 2011-03-02 MED ORDER — BENZOCAINE-MENTHOL 20-0.5 % EX AERO
INHALATION_SPRAY | CUTANEOUS | Status: AC
  Filled 2011-03-02: qty 56

## 2011-03-02 NOTE — Progress Notes (Signed)
Post Partum Day 1 Subjective: no complaints, up ad lib, voiding, tolerating PO and + flatus  Objective: Blood pressure 98/65, pulse 95, temperature 98.2 F (36.8 C), temperature source Oral, resp. rate 18, height 5\' 2"  (1.575 m), weight 94.802 kg (209 lb), last menstrual period 05/25/2010, SpO2 99.00%, unknown if currently breastfeeding.  Physical Exam:  General: alert, cooperative and no distress Lochia: appropriate Uterine Fundus: firm DVT Evaluation: No evidence of DVT seen on physical exam. Negative Homan's sign.   Basename 03/01/11 1750  HGB 12.9  HCT 38.9    Assessment/Plan: Plan for discharge tomorrow and Breastfeeding   LOS: 1 day   Haldon Carley H. 03/02/2011, 6:39 AM

## 2011-03-02 NOTE — Progress Notes (Signed)
Experienced breastfeeding mother x 12 months. States that infant is breastfeeding well and she will call lactation if needed.

## 2011-03-02 NOTE — Anesthesia Postprocedure Evaluation (Signed)
  Anesthesia Post-op Note  Patient: Deborah Barr  Procedure(s) Performed: * No procedures listed *  Patient stable following vaginal delivery.  Epidural catheter removed.

## 2011-03-03 MED ORDER — DOCUSATE SODIUM 100 MG PO CAPS: 100.0000 mg | ORAL_CAPSULE | Freq: Two times a day (BID) | ORAL | Status: AC

## 2011-03-03 MED ORDER — IBUPROFEN 600 MG PO TABS: 600.0000 mg | ORAL_TABLET | Freq: Four times a day (QID) | ORAL | Status: AC

## 2011-03-03 MED ORDER — FERROUS SULFATE 325 (65 FE) MG PO TABS: 325.0000 mg | ORAL_TABLET | Freq: Every day | ORAL | Status: DC

## 2011-03-03 NOTE — Discharge Summary (Signed)
  Obstetric Discharge Summary Reason for Admission: onset of labor Prenatal Procedures: ultrasound Intrapartum Procedures: spontaneous vaginal delivery Postpartum Procedures: 2nd degree perineal repair with 3.0 vicryl Complications-Operative and Postpartum: none  Hemoglobin  Date Value Range Status  03/01/2011 12.9  12.0-15.0 (g/dL) Final     HCT  Date Value Range Status  03/01/2011 38.9  36.0-46.0 (%) Final    Discharge Diagnoses: Term Pregnancy-delivered  Discharge Information: Date: 03/03/2011 Activity: pelvic rest Diet: routine Medications: Ibuprophen, Colace and Iron Condition: stable Instructions: refer to practice specific booklet Follow up: [facility]. Contraception: condoms.  Breastfeeding Discharge to: home   Newborn Data: Live born  Information for the patient's newborn:  Emaley, Applin [[id number]]  female ; APGAR 8/9 , ; weight ; 9lb11 Home with mother.  [name] 03/03/2011, 9:15 AM

## 2011-03-03 NOTE — Progress Notes (Signed)
Post Partum Day 2 Subjective: no complaints, up ad lib, pt states no bleeding and pain, voiding, tolerating PO, BM last night.  Continues to breast feed with no complications and states will use condoms PP BC.  Objective: Blood pressure 96/61, pulse 89, temperature 98.2 F (36.8 C), temperature source Oral, resp. rate 18, height 5\' 2"  (1.575 m), weight 94.802 kg (209 lb), last menstrual period 05/25/2010, SpO2 99.00%,  Physical Exam:  General: alert, cooperative and no distress Lochia: no bleeding Heart:  RRR Crisp S1/S2.  No murmurs, rubs, gallops, or clicks. Lungs: CTAB Abdomen: +BS 4x, no pain or tenderness, fundus firm at 1cm below umbilicus PV:  Calves non tender to palpation bilaterally.  DP pulse +2 bilaterally   Basename 03/01/11 1750  HGB 12.9  HCT 38.9    Assessment/Plan: 30yo G3P2012, s/p NSVD  1.)Discharge home  2.)Breastfeeding -  3.)Contraception - condoms 4.)Colace 5.)If no complications F/U in 6 weeks with [facility]     LOS: 2 days   Deborah Doughty PA-S2 03/03/2011, 8:57 AM    Pt seen and examined with Deborah Doughty, PA-S. Agree with above. [name] PGY1

## 2011-03-10 NOTE — Discharge Summary (Signed)
Agree with above note.  Deborah Barr 03/10/2011 2:17 PM

## 2011-04-21 IMAGING — US US OB LIMITED
1 series · 14 of 23 positions shown · non-contrast
Comparison: none

CLINICAL DATA: Pregnant, cholelithiasis post cholecystectomy,
assess fetus postoperatively

[Series 1: us ob limited · 0.25mm/px · 23 acquisitions, 14 frames shown]
[im 1/23]
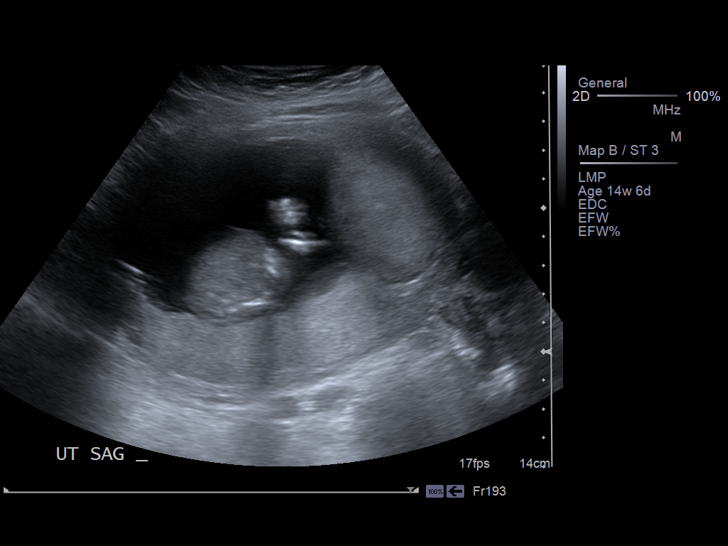
[im 3/23]
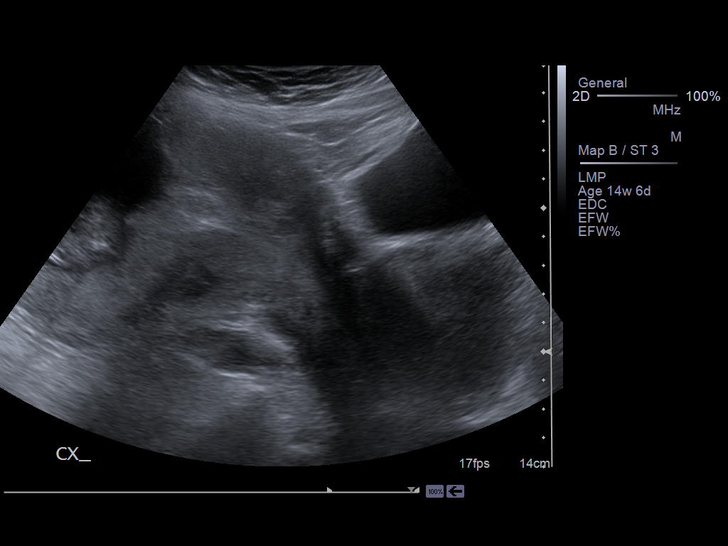
[im 5/23]
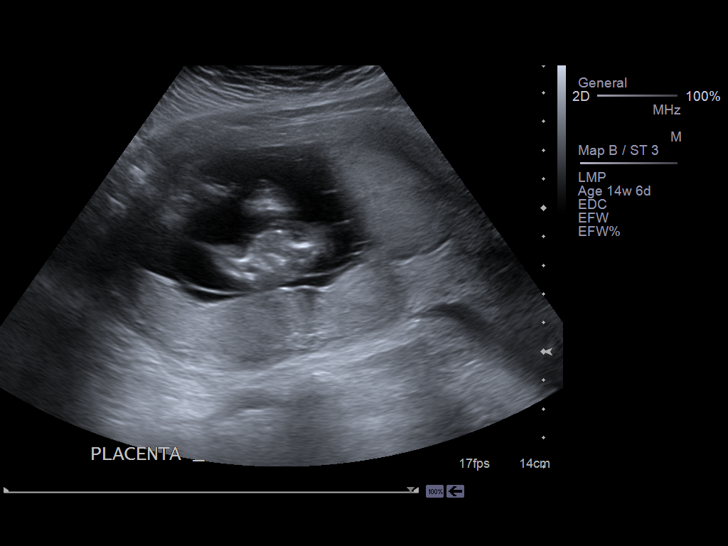
[im 6/23]
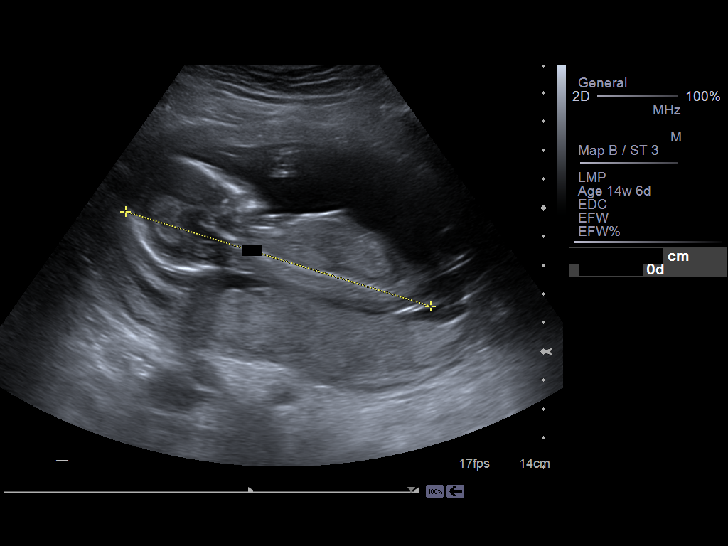
[im 8/23]
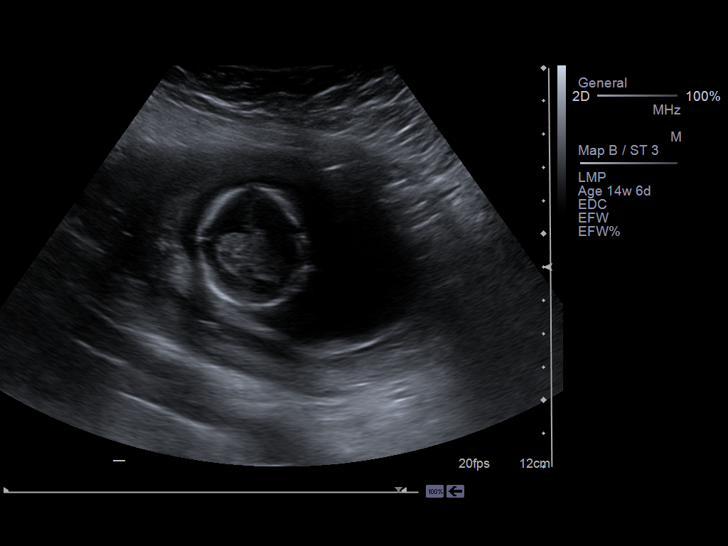
[im 10/23]
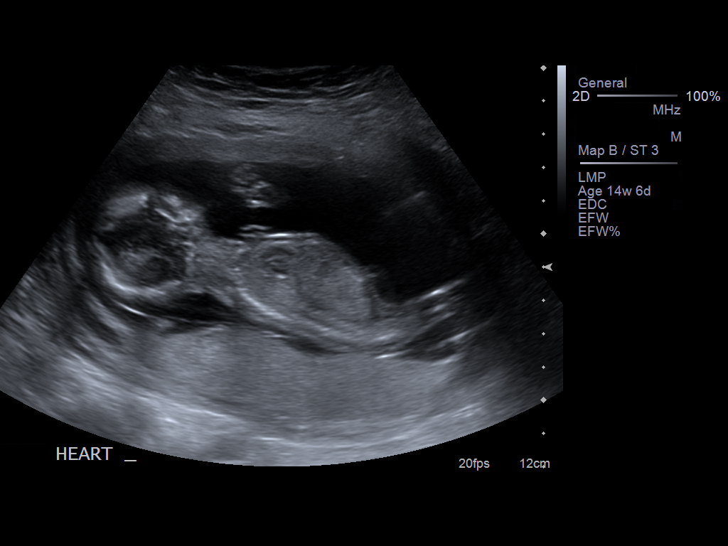
[im 11/23]
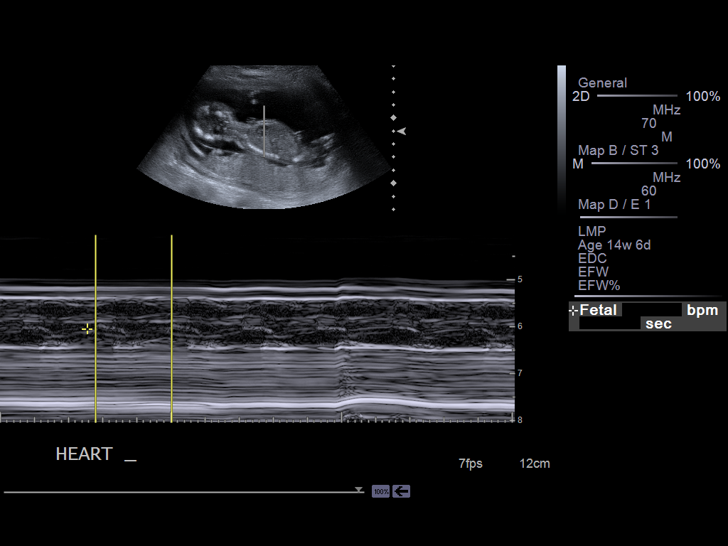
[im 13/23]
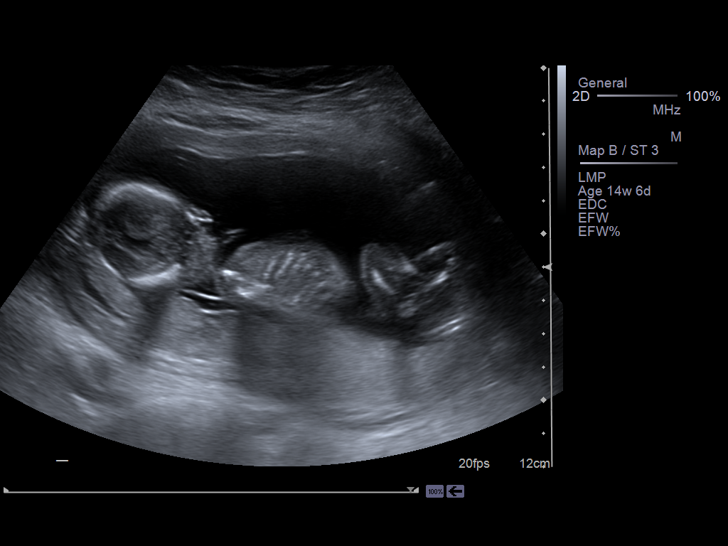
[im 14/23]
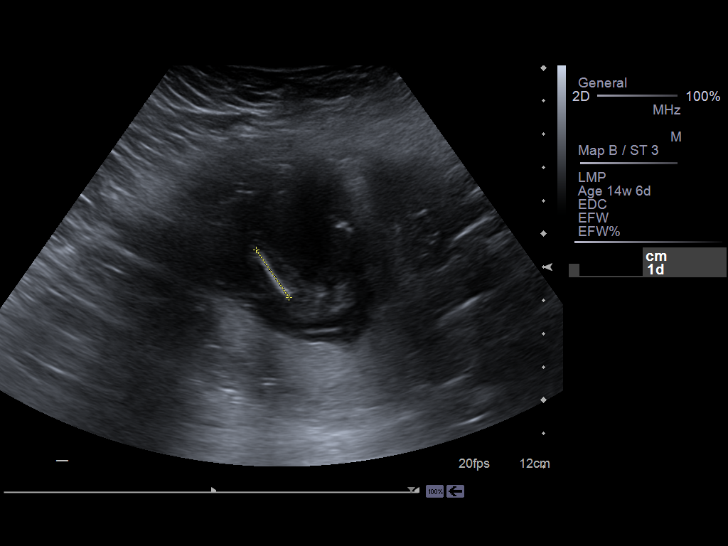
[im 16/23]
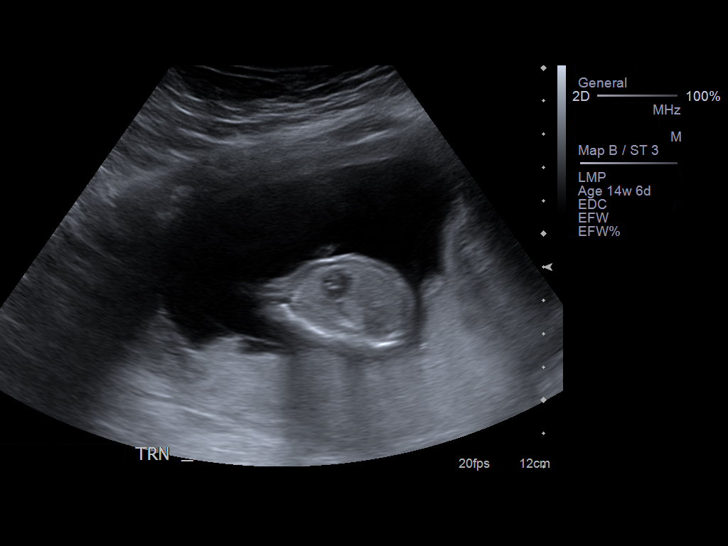
[im 18/23]
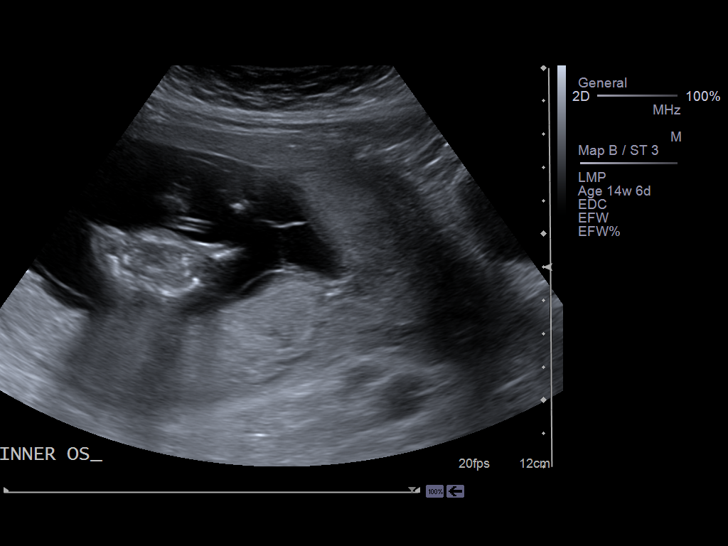
[im 19/23]
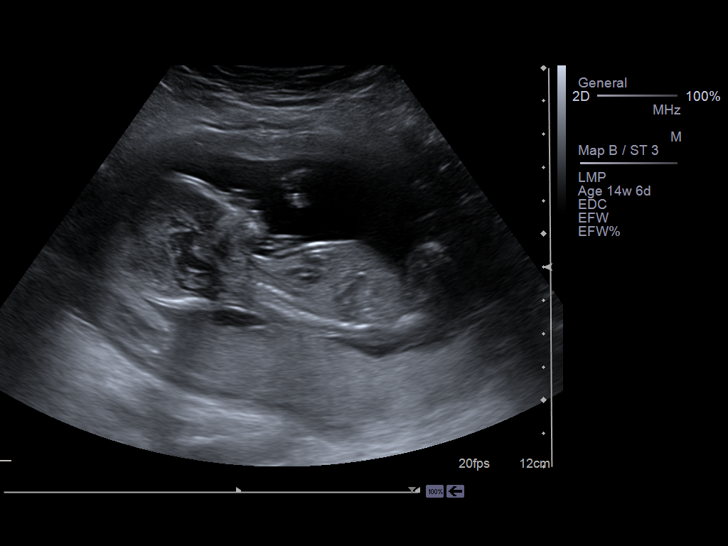
[im 21/23]
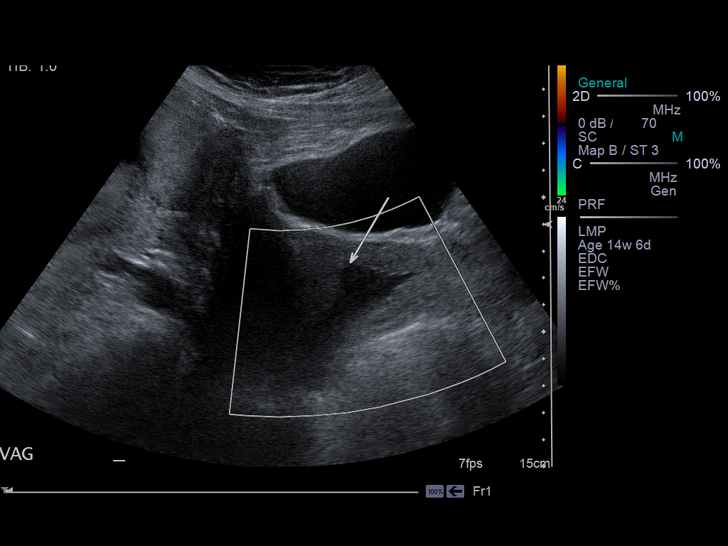
[im 23/23]
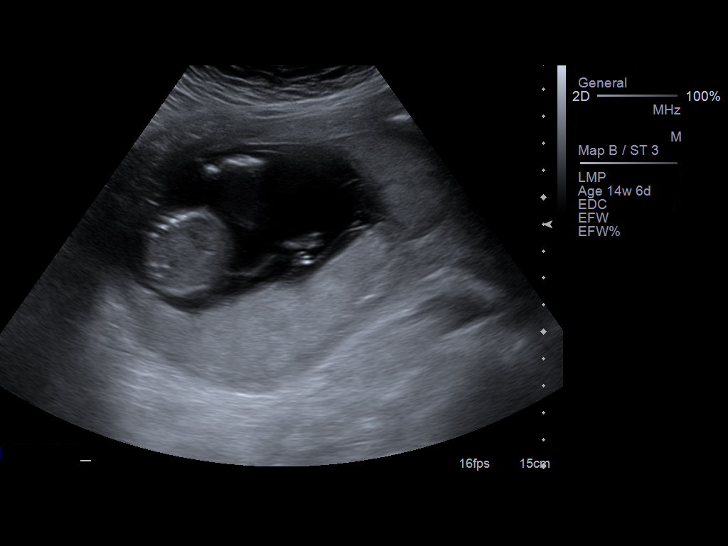

[14 of 23 positions shown; findings below may reference images not displayed]

LIMITED OBSTETRIC ULTRASOUND

Number of Fetuses: 1
Heart Rate: 135 bpm
Movement: Present
Presentation: Breech
Placental Location:  Anterior
Previa: None
Amniotic Fluid (Subjective): Normal

BPD: 3.15cm   15w   6d

MATERNAL FINDINGS:
Cervix: Minimal funneling of internal os./
Uterus/Adnexae: Small amount fluid in vagina.
No free intraperitoneal fluid or gross adnexal mass identified.
No retroplacental hemorrhage identified.
IMPRESSION: Single live intrauterine gestation measured at 15 weeks 6 days EGA.
No acute abnormalities.

Recommend non-emergent OB 14+ wk complete US examination for
complete fetal biometry and anatomic survey.  This could be
performed at the [REDACTED].

## 2011-05-05 LAB — CBC
HCT: 35.8 — ABNORMAL LOW
HCT: 36.2
Hemoglobin: 12.6
MCHC: 34.7
MCV: 92.6
MCV: 93.3
Platelets: 128 — ABNORMAL LOW
RBC: 3.89
RDW: 14.1
WBC: 15 — ABNORMAL HIGH

## 2011-05-05 LAB — RH IMMUNE GLOB WKUP(>/=20WKS)(NOT WOMEN'S HOSP)

## 2012-12-04 ENCOUNTER — Other Ambulatory Visit: Payer: Self-pay | Admitting: Obstetrics & Gynecology

## 2012-12-27 ENCOUNTER — Encounter: Payer: Self-pay | Admitting: *Deleted

## 2012-12-28 ENCOUNTER — Other Ambulatory Visit: Payer: Self-pay | Admitting: Obstetrics & Gynecology

## 2013-03-10 ENCOUNTER — Other Ambulatory Visit: Payer: Self-pay | Admitting: Obstetrics & Gynecology

## 2013-04-03 ENCOUNTER — Ambulatory Visit (INDEPENDENT_AMBULATORY_CARE_PROVIDER_SITE_OTHER): Payer: BC Managed Care – PPO | Admitting: Obstetrics & Gynecology

## 2013-04-03 ENCOUNTER — Other Ambulatory Visit (HOSPITAL_COMMUNITY)
Admission: RE | Admit: 2013-04-03 | Discharge: 2013-04-03 | Disposition: A | Discharge status: Home / Self Care | Payer: BC Managed Care – PPO | Source: Ambulatory Visit | Attending: Obstetrics & Gynecology | Admitting: Obstetrics & Gynecology

## 2013-04-03 ENCOUNTER — Encounter: Payer: Self-pay | Admitting: Obstetrics & Gynecology

## 2013-04-03 VITALS — BP 100/60 | Ht 64.0 in | Wt 148.0 lb

## 2013-04-03 DIAGNOSIS — Z01419 Encounter for gynecological examination (general) (routine) without abnormal findings: Secondary | ICD-10-CM

## 2013-04-03 DIAGNOSIS — Z1151 Encounter for screening for human papillomavirus (HPV): Secondary | ICD-10-CM | POA: Insufficient documentation

## 2013-04-03 MED ORDER — NORGESTIM-ETH ESTRAD TRIPHASIC 0.18/0.215/0.25 MG-35 MCG PO TABS: ORAL_TABLET | ORAL | Status: DC

## 2013-04-03 MED ORDER — ESCITALOPRAM OXALATE 20 MG PO TABS: ORAL_TABLET | ORAL | Status: DC

## 2013-04-03 NOTE — Addendum Note (Signed)
Addended by: Criss Alvine on: 04/03/2013 10:11 AM   Modules accepted: Orders

## 2013-04-03 NOTE — Progress Notes (Signed)
Patient ID: Deborah Barr, female   DOB: 1981-03-18, 32 y.o.   MRN: [id number] Subjective:     Deborah Barr is a 32 y.o. female here for a routine exam.  Patient's last menstrual period was 03/26/2013. [id number] Current complaints: none except diarrhea.  Personal health questionnaire reviewed: no.   Gynecologic History Patient's last menstrual period was 03/26/2013. Contraception: OCP (estrogen/progesterone) Last Pap: 2013. Results were: normal Last mammogram: na. Results were: na  Obstetric History OB History  Gravida Para Term Preterm AB SAB TAB Ectopic Multiple Living  3 2 1  0 1 1 0 0 0 2    # Outcome Date GA Lbr Len/2nd Weight Sex Delivery Anes PTL Lv  3 PAR 03/01/11  07:01 / 00:11 9 lb 11 oz (4.394 kg) F SVD EPI  Y  2 TRM 01/01/08 [redacted]w[redacted]d 15:00 9 lb 3 oz (4.167 kg) M SVD EPI  Y  1 SAB 2006               The following portions of the patient's history were reviewed and updated as appropriate: allergies, current medications, past family history, past medical history, past social history, past surgical history and problem list.  Review of Systems  Review of Systems  Constitutional: Negative for fever, chills, weight loss, malaise/fatigue and diaphoresis.  HENT: Negative for hearing loss, ear pain, nosebleeds, congestion, sore throat, neck pain, tinnitus and ear discharge.   Eyes: Negative for blurred vision, double vision, photophobia, pain, discharge and redness.  Respiratory: Negative for cough, hemoptysis, sputum production, shortness of breath, wheezing and stridor.   Cardiovascular: Negative for chest pain, palpitations, orthopnea, claudication, leg swelling and PND.  Gastrointestinal: negative for abdominal pain. Negative for heartburn, nausea, vomiting, diarrhea, constipation, blood in stool and melena.  Genitourinary: Negative for dysuria, urgency, frequency, hematuria and flank pain.  Musculoskeletal: Negative for myalgias, back pain, joint pain and falls.  Skin:  Negative for itching and rash.  Neurological: Negative for dizziness, tingling, tremors, sensory change, speech change, focal weakness, seizures, loss of consciousness, weakness and headaches.  Endo/Heme/Allergies: Negative for environmental allergies and polydipsia. Does not bruise/bleed easily.  Psychiatric/Behavioral: Negative for depression, suicidal ideas, hallucinations, memory loss and substance abuse. The patient is not nervous/anxious and does not have insomnia.        Objective:    Physical Exam  Vitals reviewed. Constitutional: She is oriented to person, place, and time. She appears well-developed and well-nourished.  HENT:  Head: Normocephalic and atraumatic.        Right Ear: External ear normal.  Left Ear: External ear normal.  Nose: Nose normal.  Mouth/Throat: Oropharynx is clear and moist.  Eyes: Conjunctivae and EOM are normal. Pupils are equal, round, and reactive to light. Right eye exhibits no discharge. Left eye exhibits no discharge. No scleral icterus.  Neck: Normal range of motion. Neck supple. No tracheal deviation present. No thyromegaly present.  Cardiovascular: Normal rate, regular rhythm, normal heart sounds and intact distal pulses.  Exam reveals no gallop and no friction rub.   No murmur heard. Respiratory: Effort normal and breath sounds normal. No respiratory distress. She has no wheezes. She has no rales. She exhibits no tenderness.  GI: Soft. Bowel sounds are normal. She exhibits no distension and no mass. There is no tenderness. There is no rebound and no guarding.  Genitourinary:       Vulva is normal without lesions Vagina is pink moist without discharge Cervix normal in appearance and pap is done Uterus is normal  size shape and contour Adnexa is negative with normal sized ovaries   Musculoskeletal: Normal range of motion. She exhibits no edema and no tenderness.  Neurological: She is alert and oriented to person, place, and time. She has normal  reflexes. She displays normal reflexes. No cranial nerve deficit. She exhibits normal muscle tone. Coordination normal.  Skin: Skin is warm and dry. No rash noted. No erythema. No pallor.  Psychiatric: She has a normal mood and affect. Her behavior is normal. Judgment and thought content normal.       Assessment:    Healthy female exam.    Plan:    Contraception: OCP (estrogen/progesterone). Follow up in: 1 year. Continue Lexapro

## 2013-04-03 NOTE — Patient Instructions (Signed)
Dumping Syndrome Rapid gastric emptying, or dumping syndrome, happens when the lower end of the small intestine fills too quickly with undigested food from the stomach. "Early" dumping begins during or right after a meal. "Late" dumping happens 1 to 3 hours after eating. Many people have both types. CAUSES   Certain types of stomach surgery that allow the stomach to empty rapidly are the main cause of dumping syndrome.  Patients with Zollinger-Ellison syndrome may also have dumping syndrome. Zollinger-Ellison syndrome is a rare disorder involving extreme peptic ulcer disease and gastrin-secreting tumors in the pancreas. SYMPTOMS Early dumping  Nausea.  Bloating.  Dizziness.  Vomiting.  Cramping.  Diarrhea.  Fatigue. Late dumping  Low blood sugar (hypoglycemia).  Weakness.  Sweating.  Dizziness. DIAGNOSIS  Doctors diagnose dumping syndrome primarily on the basis of symptoms in patients who have had gastric surgery that causes the syndrome. Tests may be needed to exclude other conditions that have similar symptoms. TREATMENT   Treatment includes changes in eating habits and medicine.  People who have dumping syndrome need to eat several small meals a day that are low in carbohydrates and should drink liquids between meals, not with them.  People with severe cases may be prescribed medicine to slow their digestion.  Caregivers may at times recommend surgery to help correct the problem. Document Released: 06/18/2004 Document Revised: 10/18/2011 Document Reviewed: 06/20/2008 [employer] Patient Information 2014 [employer]  

## 2013-10-27 ENCOUNTER — Other Ambulatory Visit: Payer: Self-pay | Admitting: Obstetrics & Gynecology

## 2013-10-30 ENCOUNTER — Ambulatory Visit (INDEPENDENT_AMBULATORY_CARE_PROVIDER_SITE_OTHER): Payer: BC Managed Care – PPO | Admitting: Adult Health

## 2013-10-30 ENCOUNTER — Encounter: Payer: Self-pay | Admitting: Adult Health

## 2013-10-30 ENCOUNTER — Encounter (INDEPENDENT_AMBULATORY_CARE_PROVIDER_SITE_OTHER): Payer: Self-pay

## 2013-10-30 VITALS — BP 110/66 | Ht 61.0 in | Wt 194.0 lb

## 2013-10-30 DIAGNOSIS — L0233 Carbuncle of buttock: Secondary | ICD-10-CM

## 2013-10-30 DIAGNOSIS — N920 Excessive and frequent menstruation with regular cycle: Secondary | ICD-10-CM

## 2013-10-30 DIAGNOSIS — L0232 Furuncle of buttock: Secondary | ICD-10-CM

## 2013-10-30 DIAGNOSIS — N946 Dysmenorrhea, unspecified: Secondary | ICD-10-CM

## 2013-10-30 HISTORY — DX: Excessive and frequent menstruation with regular cycle: N92.0

## 2013-10-30 HISTORY — DX: Dysmenorrhea, unspecified: N94.6

## 2013-10-30 HISTORY — DX: Furuncle of buttock: L02.32

## 2013-10-30 MED ORDER — SULFAMETHOXAZOLE-TMP DS 800-160 MG PO TABS: 1.0000 | ORAL_TABLET | Freq: Two times a day (BID) | ORAL | Status: DC

## 2013-10-30 NOTE — Patient Instructions (Signed)
Endometrial Ablation Endometrial ablation removes the lining of the uterus (endometrium). It is usually a same-day, outpatient treatment. Ablation helps avoid major surgery, such as surgery to remove the cervix and uterus (hysterectomy). After endometrial ablation, you will have little or no menstrual bleeding and may not be able to have children. However, if you are premenopausal, you will need to use a reliable method of birth control following the procedure because of the small chance that pregnancy can occur. There are different reasons to have this procedure, which include: Heavy periods. Bleeding that is causing anemia. Irregular bleeding. Bleeding fibroids on the lining inside the uterus if they are smaller than 3 centimeters. This procedure should not be done if: You want children in the future. You have severe cramps with your menstrual period. You have precancerous or cancerous cells in your uterus. You were recently pregnant. You have gone through menopause. You have had major surgery on the uterus, such as a cesarean delivery. LET [facility] CARE PROVIDER KNOW ABOUT: Any allergies you have. All medicines you are taking, including vitamins, herbs, eye drops, creams, and over-the-counter medicines. Previous problems you or members of your family have had with the use of anesthetics. Any blood disorders you have. Previous surgeries you have had. Medical conditions you have. RISKS AND COMPLICATIONS  Generally, this is a safe procedure. However, as with any procedure, complications can occur. Possible complications include: Perforation of the uterus. Bleeding. Infection of the uterus, bladder, or vagina. Injury to surrounding organs. An air bubble to the lung (air embolus). Pregnancy following the procedure. Failure of the procedure to help the problem, requiring hysterectomy. Decreased ability to diagnose cancer in the lining of the uterus. BEFORE THE PROCEDURE The lining of  the uterus must be tested to make sure there is no pre-cancerous or cancer cells present. An ultrasound may be performed to look at the size of the uterus and to check for abnormalities. Medicines may be given to thin the lining of the uterus. PROCEDURE  During the procedure, your health care provider will use a tool called a resectoscope to help see inside your uterus. There are different ways to remove the lining of your uterus.  Radiofrequency  This method uses a radiofrequency-alternating electric current to remove the lining of the uterus. Cryotherapy This method uses extreme cold to freeze the lining of the uterus. Heated-Free Liquid  This method uses heated salt (saline) solution to remove the lining of the uterus. Microwave This method uses high-energy microwaves to heat up the lining of the uterus to remove it. Thermal balloon  This method involves inserting a catheter with a balloon tip into the uterus. The balloon tip is filled with heated fluid to remove the lining of the uterus. AFTER THE PROCEDURE  After your procedure, do not have sexual intercourse or insert anything into your vagina until permitted by your health care provider. After the procedure, you may experience: Cramps. Vaginal discharge. Frequent urination. Document Released: 06/04/2004 Document Revised: 03/28/2013 Document Reviewed: 12/27/2012 [facility] Patient Information 2014 [place]. Menorrhagia Menorrhagia is the medical term for when your menstrual periods are heavy or last longer than usual. With menorrhagia, every period you have may cause enough blood loss and cramping that you are unable to maintain your usual activities. CAUSES  In some cases, the cause of heavy periods is unknown, but a number of conditions may cause menorrhagia. Common causes include: A problem with the hormone-producing thyroid gland (hypothyroid). Noncancerous growths in the uterus (polyps or fibroids).  An imbalance of the estrogen  and progesterone hormones. One of your ovaries not releasing an egg during one or more months. Side effects of having an intrauterine device (IUD). Side effects of some medicines, such as anti-inflammatory medicines or blood thinners. A bleeding disorder that stops your blood from clotting normally. SIGNS AND SYMPTOMS  During a normal period, bleeding lasts between 4 and 8 days. Signs that your periods are too heavy include: You routinely have to change your pad or tampon every 1 or 2 hours because it is completely soaked. You pass blood clots larger than 1 inch (2.5 cm) in size. You have bleeding for more than 7 days. You need to use pads and tampons at the same time because of heavy bleeding. You need to wake up to change your pads or tampons during the night. You have symptoms of anemia, such as tiredness, fatigue, or shortness of breath. DIAGNOSIS  Your health care provider will perform a physical exam and ask you questions about your symptoms and menstrual history. Other tests may be ordered based on what the health care provider finds during the exam. These tests can include: Blood tests To check if you are pregnant or have hormonal changes, a bleeding or thyroid disorder, low iron levels (anemia), or other problems. Endometrial biopsy Your health care provider takes a sample of tissue from the inside of your uterus to be examined under a microscope. Pelvic ultrasound This test uses sound waves to make a picture of your uterus, ovaries, and vagina. The pictures can show if you have fibroids or other growths. Hysteroscopy For this test, your health care provider will use a small telescope to look inside your uterus. Based on the results of your initial tests, your health care provider may recommend further testing. TREATMENT  Treatment may not be needed. If it is needed, your health care provider may recommend treatment with one or more medicines first. If these do not reduce bleeding  enough, a surgical treatment might be an option. The best treatment for you will depend on:  Whether you need to prevent pregnancy. Your desire to have children in the future. The cause and severity of your bleeding. Your opinion and personal preference.  Medicines for menorrhagia may include: Birth control methods that use hormones These include the pill, skin patch, vaginal ring, shots that you get every 3 months, hormonal IUD, and implant. These treatments reduce bleeding during your menstrual period. Medicines that thicken blood and slow bleeding. Medicines that reduce swelling, such as ibuprofen. Medicines that contain a synthetic hormone called progestin.  Medicines that make the ovaries stop working for a short time.  You may need surgical treatment for menorrhagia if the medicines are unsuccessful. Treatment options include: Dilation and curettage (D&C) In this procedure, your health care provider opens (dilates) your cervix and then scrapes or suctions tissue from the lining of your uterus to reduce menstrual bleeding. Operative hysteroscopy This procedure uses a tiny tube with a light (hysteroscope) to view your uterine cavity and can help in the surgical removal of a polyp that may be causing heavy periods. Endometrial ablation Through various techniques, your health care provider permanently destroys the entire lining of your uterus (endometrium). After endometrial ablation, most women have little or no menstrual flow. Endometrial ablation reduces your ability to become pregnant. Endometrial resection This surgical procedure uses an electrosurgical wire loop to remove the lining of the uterus. This procedure also reduces your ability to become pregnant. Hysterectomy Surgical removal of  the uterus and cervix is a permanent procedure that stops menstrual periods. Pregnancy is not possible after a hysterectomy. This procedure requires anesthesia and hospitalization. HOME CARE  INSTRUCTIONS  Only take over-the-counter or prescription medicines as directed by your health care provider. Take prescribed medicines exactly as directed. Do not change or switch medicines without consulting your health care provider. Take any prescribed iron pills exactly as directed by your health care provider. Long-term heavy bleeding may result in low iron levels. Iron pills help replace the iron your body lost from heavy bleeding. Iron may cause constipation. If this becomes a problem, increase the bran, fruits, and roughage in your diet. Do not take aspirin or medicines that contain aspirin 1 week before or during your menstrual period. Aspirin may make the bleeding worse. If you need to change your sanitary pad or tampon more than once every 2 hours, stay in bed and rest as much as possible until the bleeding stops. Eat well-balanced meals. Eat foods high in iron. Examples are leafy green vegetables, meat, liver, eggs, and whole grain breads and cereals. Do not try to lose weight until the abnormal bleeding has stopped and your blood iron level is back to normal. SEEK MEDICAL CARE IF:  You soak through a pad or tampon every 1 or 2 hours, and this happens every time you have a period. You need to use pads and tampons at the same time because you are bleeding so much. You need to change your pad or tampon during the night. You have a period that lasts for more than 8 days. You pass clots bigger than 1 inch wide. You have irregular periods that happen more or less often than once a month. You feel dizzy or faint. You feel very weak or tired. You feel short of breath or feel your heart is beating too fast when you exercise. You have nausea and vomiting or diarrhea while you are taking your medicine. You have any problems that may be related to the medicine you are taking. SEEK IMMEDIATE MEDICAL CARE IF:  You soak through 4 or more pads or tampons in 2 hours. You have any bleeding while you  are pregnant. MAKE SURE YOU:  Understand these instructions. Will watch your condition. Will get help right away if you are not doing well or get worse. Document Released: 07/26/2005 Document Revised: 05/16/2013 Document Reviewed: 01/14/2013 [facility] Patient Information 2014 [place]. Laparoscopic Tubal Ligation Laparoscopic tubal ligation is a procedure that closes the fallopian tubes at a time other than right after childbirth. By closing the fallopian tubes, the eggs that are released from the ovaries cannot enter the uterus and sperm cannot reach the egg. Tubal ligation is also known as getting your "tubes tied." Tubal ligation is done so you will not be able to get pregnant or have a baby.  Although this procedure may be reversed, it should be considered permanent and irreversible. If you want to have future pregnancies, you should not have this procedure.  LET YOUR CAREGIVER KNOW ABOUT: Allergies to food or medicine. Medicines taken, including vitamins, herbs, eyedrops, over-the-counter medicines, and creams. Use of steroids (by mouth or creams). Previous problems with numbing medicines. History of bleeding problems or blood clots. Any recent colds or infections. Previous surgery. Other health problems, including diabetes and kidney problems. Possibility of pregnancy, if this applies. Any past pregnancies. RISKS AND COMPLICATIONS  Infection. Bleeding. Injury to surrounding organs. Anesthetic side effects. Failure of the procedure. Ectopic pregnancy. Future regret  about having the procedure done. BEFORE THE PROCEDURE Do not take aspirin or blood thinners a week before the procedure or as directed. This can cause bleeding. Do not eat or drink anything 6 to 8 hours before the procedure. PROCEDURE  You may be given a medicine to help you relax (sedative) before the procedure. You will be given a medicine to make you sleep (general anesthetic) during the procedure. A tube  will be put down your throat to help your breath while under general anesthesia. Two small cuts (incisions) are made in the lower abdominal area and near the belly button. Your abdominal area will be inflated with a safe gas (carbon dioxide). This helps give the surgeon room to operate, visualize, and helps the surgeon avoid other organs. A thin, lighted tube (laparoscope) with a camera attached is inserted into your abdomen through one of the incisions near the belly button. Other small instruments are also inserted through the other abdominal incision. The fallopian tubes are located and are either blocked with a ring, clip, or are burned (cauterized). After the fallopian tubes are blocked, the gas is released from the abdomen. The incisions will be closed with stitches (sutures), and a bandage may be placed over the incisions. AFTER THE PROCEDURE  You will rest in a recovery room for 1 4 hours until you are stable and doing well. You will also have some mild abdominal discomfort for 3 7 days. You will be given pain medicine to ease any discomfort. As long as there are no problems, you may be allowed to go home. Someone will need to drive you home and be with you for at least 24 hours once home. You may have some mild discomfort in the throat. This is from the tube placed in your throat while you were sleeping. You may experience discomfort in the shoulder area from some trapped air between the liver and diaphragm. This sensation is normal and will slowly go away on its own. Document Released: 11/01/2000 Document Revised: 01/25/2012 Document Reviewed: 11/06/2011 [facility] Patient Information 2014 [place]. Abscess An abscess is an infected area that contains a collection of pus and debris.It can occur in almost any part of the body. An abscess is also known as a furuncle or boil. CAUSES  An abscess occurs when tissue gets infected. This can occur from blockage of oil or sweat glands,  infection of hair follicles, or a minor injury to the skin. As the body tries to fight the infection, pus collects in the area and creates pressure under the skin. This pressure causes pain. People with weakened immune systems have difficulty fighting infections and get certain abscesses more often.  SYMPTOMS Usually an abscess develops on the skin and becomes a painful mass that is red, warm, and tender. If the abscess forms under the skin, you may feel a moveable soft area under the skin. Some abscesses break open (rupture) on their own, but most will continue to get worse without care. The infection can spread deeper into the body and eventually into the bloodstream, causing you to feel ill.  DIAGNOSIS  Your caregiver will take your medical history and perform a physical exam. A sample of fluid may also be taken from the abscess to determine what is causing your infection. TREATMENT  Your caregiver may prescribe antibiotic medicines to fight the infection. However, taking antibiotics alone usually does not cure an abscess. Your caregiver may need to make a small cut (incision) in the abscess to drain  the pus. In some cases, gauze is packed into the abscess to reduce pain and to continue draining the area. HOME CARE INSTRUCTIONS   Only take over-the-counter or prescription medicines for pain, discomfort, or fever as directed by your caregiver.  If you were prescribed antibiotics, take them as directed. Finish them even if you start to feel better.  If gauze is used, follow your caregiver's directions for changing the gauze.  To avoid spreading the infection:  Keep your draining abscess covered with a bandage.  Wash your hands well.  Do not share personal care items, towels, or whirlpools with others.  Avoid skin contact with others.  Keep your skin and clothes clean around the abscess.  Keep all follow-up appointments as directed by your caregiver. SEEK MEDICAL CARE IF:   You have  increased pain, swelling, redness, fluid drainage, or bleeding.  You have muscle aches, chills, or a general ill feeling.  You have a fever. MAKE SURE YOU:   Understand these instructions.  Will watch your condition.  Will get help right away if you are not doing well or get worse. Document Released: 05/05/2005 Document Revised: 01/25/2012 Document Reviewed: 10/08/2011 [facility] Patient Information 2014 [place]. Return in 2 weeks for [place] and see me

## 2013-10-30 NOTE — Progress Notes (Signed)
Subjective:     Patient ID: Deborah Barr, female   DOB: 12-Oct-1980, 33 y.o.   MRN: [id number]  HPI Deborah Barr is a 33 year old white female in complaining of a boil and also heavy periods that are painful at times,she is off OCs so she can lose weight, but uses condoms and she says she does not want more children.she has lost about 12 lbs in a month on Qsymia.  Review of Systems See HPI Reviewed past medical,surgical, social and family history. Reviewed medications and allergies.     Objective:   Physical Exam BP 110/66  Ht 5\' 1"  (1.549 m)  Wt 194 lb (87.998 kg)  BMI 36.67 kg/m2  LMP 10/25/2013   Has resolving boil right buttock and 2 comedones,that were easily released  Assessment:     Boil right buttock Menorrhagia Dysmenorrhea     Plan:     Rx septra ds 1 bid x 14 days with 1 refill Review handouts on mirena IUD,endo ablation and tubal ligation and boil  Return in 2 weeks for [place] and see me and discuss options

## 2013-11-12 ENCOUNTER — Encounter: Payer: Self-pay | Admitting: Adult Health

## 2013-11-12 ENCOUNTER — Ambulatory Visit (INDEPENDENT_AMBULATORY_CARE_PROVIDER_SITE_OTHER): Payer: BC Managed Care – PPO | Admitting: Adult Health

## 2013-11-12 ENCOUNTER — Ambulatory Visit (INDEPENDENT_AMBULATORY_CARE_PROVIDER_SITE_OTHER): Payer: BC Managed Care – PPO

## 2013-11-12 VITALS — BP 120/80 | Ht 61.0 in | Wt 193.0 lb

## 2013-11-12 DIAGNOSIS — N920 Excessive and frequent menstruation with regular cycle: Secondary | ICD-10-CM

## 2013-11-12 DIAGNOSIS — N946 Dysmenorrhea, unspecified: Secondary | ICD-10-CM

## 2013-11-12 NOTE — Progress Notes (Signed)
Subjective:     Patient ID: Deborah Barr, female   DOB: 10/22/1980, 33 y.o.   MRN: [id number]  HPI Maura is back for [place] to assess for menorrhagia and dysmenorrhea, and discuss birth control.  Review of Systems See HPI Reviewed past medical,surgical, social and family history. Reviewed medications and allergies.     Objective:   Physical Exam BP 120/80  Ht 5\' 1"  (1.549 m)  Wt 193 lb (87.544 kg)  BMI 36.49 kg/m2  LMP 10/25/2013    Reviewed [place]: Uterus 7.9 x 5.6 x 3.5 cm, Retroverted uterus no myometrial masses noted  Endometrium 6.9 mm, symmetrical,  Right ovary 2.9 x 2.4 x 2.2 cm, with dominant follicle noted  Left ovary 2.6 x 1.6 x 1.5 cm,  No adnexal masses or free fluid noted within the pelvis  Technician Comments:  Retroverted uterus noted, Endom-6.87mm symmetrical, bilateral adnexa appears WNL no adnexal masses or free fluid noted within. Pt aware [place] normal.reviewed handouts at home and decided she Wants to proceed with tubal and ablation.   Assessment:    Menorrhagia  Dysmenorrhea Desires sterilization   Plan:     Return in 2 weeks for pre op with Dr Elonda Husky for BTL and ablation

## 2013-11-12 NOTE — Patient Instructions (Signed)
Return in 2 weeks for pre op for BTL and ablation

## 2013-11-26 ENCOUNTER — Encounter: Payer: Self-pay | Admitting: Obstetrics & Gynecology

## 2013-11-26 ENCOUNTER — Ambulatory Visit (INDEPENDENT_AMBULATORY_CARE_PROVIDER_SITE_OTHER): Payer: BC Managed Care – PPO | Admitting: Obstetrics & Gynecology

## 2013-11-26 VITALS — BP 100/60 | Ht 61.0 in | Wt 195.0 lb

## 2013-11-26 DIAGNOSIS — Z3009 Encounter for other general counseling and advice on contraception: Secondary | ICD-10-CM

## 2013-11-26 DIAGNOSIS — Z302 Encounter for sterilization: Secondary | ICD-10-CM

## 2013-11-26 DIAGNOSIS — N946 Dysmenorrhea, unspecified: Secondary | ICD-10-CM

## 2013-11-26 DIAGNOSIS — N920 Excessive and frequent menstruation with regular cycle: Secondary | ICD-10-CM

## 2013-11-26 MED ORDER — MEGESTROL ACETATE 40 MG PO TABS: 40.0000 mg | ORAL_TABLET | Freq: Every day | ORAL | Status: DC

## 2013-11-26 NOTE — Progress Notes (Signed)
Patient ID: Deborah Barr, female   DOB: [birth year], 33 y.o.   MRN: [id number] Pt wants a laparoscopic tub al, selects complete removal anfd management of her periods with endometrial ablation  Sonogram is normal, reviewed  Scheduled 12/19/2013 Megestrol pre op  Past Medical History  Diagnosis Date  . History of cholecystectomy 2012    at 16 weeks  . 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 HPV (human papilloma virus) anogenital infection   . HPV (human papilloma virus) infection   . Abnormal Pap smear   . Seasonal allergies   . Recurrent sinus infections   . Gallstone   . Vaginal Pap smear, abnormal   . Boil of buttock 10/30/2013  . Menorrhagia 10/30/2013  . Dysmenorrhea 10/30/2013    Past Surgical History  Procedure Laterality Date  . Nasal septum surgery  2011  . Laparoscopic cholecystectomy  09/04/10    Dr. Ander Gaster performed at [redacted] weeks gestation  . Eye muscle surgery      age 56  . Tympanoplasty      childhood  . Leep  2001    [facility]  . Cholecystectomy  2012  . Eye muscle surgery      as child  . Leep      OB History   Grav Para Term Preterm Abortions TAB SAB Ect Mult Living   3 2 1  0 1 0 1 0 0 2      Allergies  Allergen Reactions  . Codeine Nausea And Vomiting  . Neosporin [Neomycin-Polymyxin-Gramicidin] Rash    History   Social History  . Marital Status: Married    Spouse Name: N/A    Number of Children: 1  . Years of Education: 12   Occupational History  . homemaker    Social History Main Topics  . Smoking status: Never Smoker   . Smokeless tobacco: Never Used     Comment: never used snuff or chewing tobacco.  . Alcohol Use: No  . Drug Use: No  . Sexual Activity: Yes    Birth Control/ Protection: None, Condom   Other Topics Concern  . None   Social History Narrative  . None    Family History  Problem Relation Age of Onset  . Heart disease Father   . Hypertension Father

## 2013-12-04 ENCOUNTER — Encounter (HOSPITAL_COMMUNITY): Payer: Self-pay | Admitting: Pharmacy Technician

## 2013-12-13 ENCOUNTER — Encounter (HOSPITAL_COMMUNITY)
Admission: RE | Admit: 2013-12-13 | Discharge: 2013-12-13 | Disposition: A | Discharge status: Home / Self Care | Payer: BC Managed Care – PPO | Source: Ambulatory Visit | Attending: Obstetrics & Gynecology | Admitting: Obstetrics & Gynecology

## 2013-12-13 ENCOUNTER — Encounter (HOSPITAL_COMMUNITY): Payer: Self-pay

## 2013-12-13 DIAGNOSIS — Z01812 Encounter for preprocedural laboratory examination: Secondary | ICD-10-CM | POA: Insufficient documentation

## 2013-12-13 HISTORY — DX: Depression, unspecified: F32.A

## 2013-12-13 HISTORY — DX: Major depressive disorder, single episode, unspecified: F32.9

## 2013-12-13 HISTORY — DX: Unspecified convulsions: R56.9

## 2013-12-13 HISTORY — DX: Anxiety disorder, unspecified: F41.9

## 2013-12-13 LAB — HCG, QUANTITATIVE, PREGNANCY

## 2013-12-13 LAB — COMPREHENSIVE METABOLIC PANEL
ALBUMIN: 3.8 g/dL (ref 3.5–5.2)
ALK PHOS: 75 U/L (ref 39–117)
ALT: 11 U/L (ref 0–35)
AST: 15 U/L (ref 0–37)
BILIRUBIN TOTAL: 0.4 mg/dL (ref 0.3–1.2)
BUN: 10 mg/dL (ref 6–23)
CHLORIDE: 107 meq/L (ref 96–112)
CO2: 25 mEq/L (ref 19–32)
CREATININE: 0.84 mg/dL (ref 0.50–1.10)
Calcium: 9.3 mg/dL (ref 8.4–10.5)
GFR calc non Af Amer: 90 mL/min — ABNORMAL LOW (ref >90)
GLUCOSE: 103 mg/dL — AB (ref 70–99)
Potassium: 4 mEq/L (ref 3.7–5.3)
Sodium: 142 mEq/L (ref 137–147)
Total Protein: 6.9 g/dL (ref 6.0–8.3)

## 2013-12-13 LAB — CBC
HEMATOCRIT: 37.7 % (ref 36.0–46.0)
HEMOGLOBIN: 12.7 g/dL (ref 12.0–15.0)
MCH: 29.4 pg (ref 26.0–34.0)
MCHC: 33.7 g/dL (ref 30.0–36.0)
MCV: 87.3 fL (ref 78.0–100.0)
Platelets: 232 10*3/uL (ref 150–400)
RBC: 4.32 MIL/uL (ref 3.87–5.11)
RDW: 12.8 % (ref 11.5–15.5)
WBC: 7.4 10*3/uL (ref 4.0–10.5)

## 2013-12-13 LAB — URINALYSIS, ROUTINE W REFLEX MICROSCOPIC
Bilirubin Urine: NEGATIVE
GLUCOSE, UA: NEGATIVE mg/dL
Hgb urine dipstick: NEGATIVE
KETONES UR: NEGATIVE mg/dL
LEUKOCYTES UA: NEGATIVE
NITRITE: NEGATIVE
PROTEIN: NEGATIVE mg/dL
Specific Gravity, Urine: >1.03 — ABNORMAL HIGH (ref 1.005–1.030)
UROBILINOGEN UA: 0.2 mg/dL (ref 0.0–1.0)
pH: 6 (ref 5.0–8.0)

## 2013-12-13 NOTE — Patient Instructions (Signed)
Deborah Barr  12/13/2013   Your procedure is scheduled on:  12/19/2013  Report to [facility] at  700  AM.  Call this number if you have problems the morning of surgery: [phone]   Remember:   Do not eat food or drink liquids after midnight.   Take these medicines the morning of surgery with A SIP OF WATER: lexapro   Do not wear jewelry, make-up or nail polish.  Do not wear lotions, powders, or perfumes.   Do not shave 48 hours prior to surgery. Men may shave face and neck.  Do not bring valuables to the hospital.  [facility] is not responsible for any belongings or valuables.               Contacts, dentures or bridgework may not be worn into surgery.  Leave suitcase in the car. After surgery it may be brought to your room.  For patients admitted to the hospital, discharge time is determined by your treatment team.               Patients discharged the day of surgery will not be allowed to drive home.  Name and phone number of your driver: family  Special Instructions: Shower using CHG 2 nights before surgery and the night before surgery.  If you shower the day of surgery use CHG.  Use special wash - you have one bottle of CHG for all showers.  You should use approximately 1/3 of the bottle for each shower.   Please read over the following fact sheets that you were given: Pain Booklet, Coughing and Deep Breathing, Surgical Site Infection Prevention, Anesthesia Post-op Instructions and Care and Recovery After Surgery Endometrial Ablation Endometrial ablation removes the lining of the uterus (endometrium). It is usually a same-day, outpatient treatment. Ablation helps avoid major surgery, such as surgery to remove the cervix and uterus (hysterectomy). After endometrial ablation, you will have little or no menstrual bleeding and may not be able to have children. However, if you are premenopausal, you will need to use a reliable method of birth control following the  procedure because of the small chance that pregnancy can occur. There are different reasons to have this procedure, which include:  Heavy periods.  Bleeding that is causing anemia.  Irregular bleeding.  Bleeding fibroids on the lining inside the uterus if they are smaller than 3 centimeters. This procedure should not be done if:  You want children in the future.  You have severe cramps with your menstrual period.  You have precancerous or cancerous cells in your uterus.  You were recently pregnant.  You have gone through menopause.  You have had major surgery on the uterus, such as a cesarean delivery. LET [facility] CARE PROVIDER KNOW ABOUT:  Any allergies you have.  All medicines you are taking, including vitamins, herbs, eye drops, creams, and over-the-counter medicines.  Previous problems you or members of your family have had with the use of anesthetics.  Any blood disorders you have.  Previous surgeries you have had.  Medical conditions you have. RISKS AND COMPLICATIONS  Generally, this is a safe procedure. However, as with any procedure, complications can occur. Possible complications include:  Perforation of the uterus.  Bleeding.  Infection of the uterus, bladder, or vagina.  Injury to surrounding organs.  An air bubble to the lung (air embolus).  Pregnancy following the procedure.  Failure of the procedure to  help the problem, requiring hysterectomy.  Decreased ability to diagnose cancer in the lining of the uterus. BEFORE THE PROCEDURE  The lining of the uterus must be tested to make sure there is no pre-cancerous or cancer cells present.  An ultrasound may be performed to look at the size of the uterus and to check for abnormalities.  Medicines may be given to thin the lining of the uterus. PROCEDURE  During the procedure, your health care provider will use a tool called a resectoscope to help see inside your uterus. There are different ways  to remove the lining of your uterus.   Radiofrequency  This method uses a radiofrequency-alternating electric current to remove the lining of the uterus.  Cryotherapy This method uses extreme cold to freeze the lining of the uterus.  Heated-Free Liquid  This method uses heated salt (saline) solution to remove the lining of the uterus.  Microwave This method uses high-energy microwaves to heat up the lining of the uterus to remove it.  Thermal balloon  This method involves inserting a catheter with a balloon tip into the uterus. The balloon tip is filled with heated fluid to remove the lining of the uterus. AFTER THE PROCEDURE  After your procedure, do not have sexual intercourse or insert anything into your vagina until permitted by your health care provider. After the procedure, you may experience:  Cramps.  Vaginal discharge.  Frequent urination. Document Released: 06/04/2004 Document Revised: 03/28/2013 Document Reviewed: 12/27/2012 [facility] Patient Information 2014 [place]. Hysteroscopy Hysteroscopy is a procedure used for looking inside the womb (uterus). It may be done for various reasons, including:  To evaluate abnormal bleeding, fibroid (benign, noncancerous) tumors, polyps, scar tissue (adhesions), and possibly cancer of the uterus.  To look for lumps (tumors) and other uterine growths.  To look for causes of why a woman cannot get pregnant (infertility), causes of recurrent loss of pregnancy (miscarriages), or a lost intrauterine device (IUD).  To perform a sterilization by blocking the fallopian tubes from inside the uterus. In this procedure, a thin, flexible tube with a tiny light and camera on the end of it (hysteroscope) is used to look inside the uterus. A hysteroscopy should be done right after a menstrual period to be sure you are not pregnant. LET [facility] CARE PROVIDER KNOW ABOUT:   Any allergies you have.  All medicines you are taking, including  vitamins, herbs, eye drops, creams, and over-the-counter medicines.  Previous problems you or members of your family have had with the use of anesthetics.  Any blood disorders you have.  Previous surgeries you have had.  Medical conditions you have. RISKS AND COMPLICATIONS  Generally, this is a safe procedure. However, as with any procedure, complications can occur. Possible complications include:  Putting a hole in the uterus.  Excessive bleeding.  Infection.  Damage to the cervix.  Injury to other organs.  Allergic reaction to medicines.  Too much fluid used in the uterus for the procedure. BEFORE THE PROCEDURE   Ask your health care provider about changing or stopping any regular medicines.  Do not take aspirin or blood thinners for 1 week before the procedure, or as directed by your health care provider. These can cause bleeding.  If you smoke, do not smoke for 2 weeks before the procedure.  In some cases, a medicine is placed in the cervix the day before the procedure. This medicine makes the cervix have a larger opening (dilate). This makes it easier for the  instrument to be inserted into the uterus during the procedure.  Do not eat or drink anything for at least 8 hours before the surgery.  Arrange for someone to take you home after the procedure. PROCEDURE   You may be given a medicine to relax you (sedative). You may also be given one of the following:  A medicine that numbs the area around the cervix (local anesthetic).  A medicine that makes you sleep through the procedure (general anesthetic).  The hysteroscope is inserted through the vagina into the uterus. The camera on the hysteroscope sends a picture to a TV screen. This gives the surgeon a good view inside the uterus.  During the procedure, air or a liquid is put into the uterus, which allows the surgeon to see better.  Sometimes, tissue is gently scraped from inside the uterus. These tissue samples  are sent to a lab for testing. AFTER THE PROCEDURE   If you had a general anesthetic, you may be groggy for a couple hours after the procedure.  If you had a local anesthetic, you will be able to go home as soon as you are stable and feel ready.  You may have some cramping. This normally lasts for a couple days.  You may have bleeding, which varies from light spotting for a few days to menstrual-like bleeding for 3 7 days. This is normal.  If your test results are not back during the visit, make an appointment with your health care provider to find out the results. Document Released: 11/01/2000 Document Revised: 05/16/2013 Document Reviewed: 02/22/2013 [facility] Patient Information 2014 [place], [place]. Dilation and Curettage or Vacuum Curettage Dilation and curettage (D&C) and vacuum curettage are minor procedures. A D&C involves stretching (dilation) the cervix and scraping (curettage) the inside lining of the womb (uterus). During a D&C, tissue is gently scraped from the inside lining of the uterus. During a vacuum curettage, the lining and tissue in the uterus are removed with the use of gentle suction.  Curettage may be performed to either diagnose or treat a problem. As a diagnostic procedure, curettage is performed to examine tissues from the uterus. A diagnostic curettage may be performed for the following symptoms:   Irregular bleeding in the uterus.   Bleeding with the development of clots.   Spotting between menstrual periods.   Prolonged menstrual periods.   Bleeding after menopause.   No menstrual period (amenorrhea).   A change in size and shape of the uterus.  As a treatment procedure, curettage may be performed for the following reasons:   Removal of an IUD (intrauterine device).   Removal of retained placenta after giving birth. Retained placenta can cause an infection or bleeding severe enough to require transfusions.   Abortion.   Miscarriage.    Removal of polyps inside the uterus.   Removal of uncommon types of noncancerous lumps (fibroids).  LET [facility] CARE PROVIDER KNOW ABOUT:   Any allergies you have.   All medicines you are taking, including vitamins, herbs, eye drops, creams, and over-the-counter medicines.   Previous problems you or members of your family have had with the use of anesthetics.   Any blood disorders you have.   Previous surgeries you have had.   Medical conditions you have. RISKS AND COMPLICATIONS  Generally, this is a safe procedure. However, as with any procedure, complications can occur. Possible complications include:  Excessive bleeding.   Infection of the uterus.   Damage to the cervix.   Development  of scar tissue (adhesions) inside the uterus, later causing abnormal amounts of menstrual bleeding.   Complications from the general anesthetic, if a general anesthetic is used.   Putting a hole (perforation) in the uterus. This is rare.  BEFORE THE PROCEDURE   Eat and drink before the procedure only as directed by your health care provider.   Arrange for someone to take you home.  PROCEDURE  This procedure usually takes about 15 30 minutes.  You will be given one of the following:  A medicine that numbs the area in and around the cervix (local anesthetic).   A medicine to make you sleep through the procedure (general anesthetic).  You will lie on your back with your legs in stirrups.   A warm metal or plastic instrument (speculum) will be placed in your vagina to keep it open and to allow the health care provider to see the cervix.  There are two ways in which your cervix can be softened and dilated. These include:   Taking a medicine.   Having thin rods (laminaria) inserted into your cervix.   A curved tool (curette) will be used to scrape cells from the inside lining of the uterus. In some cases, gentle suction is applied with the curette. The  curette will then be removed.  AFTER THE PROCEDURE   You will rest in the recovery area until you are stable and are ready to go home.   You may feel sick to your stomach (nauseous) or throw up (vomit) if you were given a general anesthetic.   You may have a sore throat if a tube was placed in your throat during general anesthesia.   You may have light cramping and bleeding. This may last for 2 days to 2 weeks after the procedure.   Your uterus needs to make a new lining after the procedure. This may make your next period late. Document Released: 07/26/2005 Document Revised: 03/28/2013 Document Reviewed: 02/22/2013 [facility] Patient Information 2014 [employer], [place]. Sterilization Information, Female Female sterilization is a procedure to permanently prevent pregnancy. There are different ways to perform sterilization, but all either block or close the fallopian tubes so that your eggs cannot reach your uterus. If your egg cannot reach your uterus, sperm cannot fertilize the egg, and you cannot get pregnant.  Sterilization is performed by a surgical procedure. Sometimes these procedures are performed in a hospital while a patient is asleep. Sometimes they can be done in a clinic setting with the patient awake. The fallopian tubes can be surgically cut, tied, or sealed through a procedure called tubal ligation. The fallopian tubes can also be closed with clips or rings. Sterilization can also be done by placing a tiny coil into each fallopian tube, which causes scar tissue to grow inside the tube. The scar tissue then blocks the tubes.  Discuss sterilization with your caregiver to answer any concerns you or your partner may have. You may want to ask what type of sterilization your caregiver performs. Some caregivers may not perform all the various options. Sterilization is permanent and should only be done if you are sure you do not want children or do not want any more children. Having a  sterilization reversed may not be successful.  STERILIZATION PROCEDURES  Laparoscopic sterilization. This is a surgical method performed at a time other than right after childbirth. Two incisions are made in the lower abdomen. A thin, lighted tube (laparoscope) is inserted into one of the incisions and is used  to perform the procedure. The fallopian tubes are closed with a ring or a clip. An instrument that uses heat could be used to seal the tubes closed (electrocautery).   Mini-laparotomy. This is a surgical method done 1 or 2 days after giving birth. Typically, a small incision is made just below the belly button (umbilicus) and the fallopian tubes are exposed. The tubes can then be sealed, tied, or cut.   Hysteroscopic sterilization. This is performed at a time other than right after childbirth. A tiny, spring-like coil is inserted through the cervix and uterus and placed into the fallopian tubes. The coil causes scaring and blocks the tubes. Other forms of contraception should be used for 3 months after the procedure to allow the scar tissue to form completely. Additionally, it is required hysterosalpingography be done 3 months later to ensure that the procedure was successful. Hysterosalpingography is a procedure that uses X-rays to look at your uterus and fallopian tubes after a material to make them show up better has been inserted. IS STERILIZATION SAFE? Sterilization is considered safe with very rare complications. Risks depend on the type of procedure you have. As with any surgical procedure, there are risks. Some risks of sterilization by any means include:   Bleeding.  Infection.  Reaction to anesthesia medicine.  Injury to surrounding organs. Risks specific to having hysteroscopic coils placed include:  The coils may not be placed correctly the first time.   The coils may move out of place.   The tubes may not get completely blocked after 3 months.   Injury to  surrounding organs when placing the coil.  HOW EFFECTIVE IS FEMALE STERILIZATION? Sterilization is nearly 100% effective, but it can fail. Depending on the type of sterilization, the rate of failure can be as high as 3%. After hysteroscopic sterilization with placement of fallopian tube coils, you will need back-up birth control for 3 months after the procedure. Sterilization is effective for a lifetime.  BENEFITS OF STERILIZATION  It does not affect your hormones, and therefore will not affect your menstrual periods, sexual desire, or performance.   It is effective for a lifetime.   It is safe.   You do not need to worry about getting pregnant. Keep in mind that if you had the hysteroscopic placement procedure, you must wait 3 months after the procedure (or until your caregiver confirms) before pregnancy is not considered possible.   There are no side effects unlike other types of birth control (contraception).  DRAWBACKS OF STERILIZATION  You must be sure you do not want children or any more children. The procedure is permanent.   It does not provide protection against sexually transmitted infections (STIs).   The tubes can grow back together. If this happens, there is a risk of pregnancy. There is also an increased risk (50%) of pregnancy being an ectopic pregnancy. This is a pregnancy that happens outside of the uterus. Document Released: 01/12/2008 Document Revised: 01/25/2012 Document Reviewed: 11/11/2011 [facility] Patient Information 2014 [place], [place]. PATIENT INSTRUCTIONS POST-ANESTHESIA  IMMEDIATELY FOLLOWING SURGERY:  Do not drive or operate machinery for the first twenty four hours after surgery.  Do not make any important decisions for twenty four hours after surgery or while taking narcotic pain medications or sedatives.  If you develop intractable nausea and vomiting or a severe headache please notify your doctor immediately.  FOLLOW-UP:  Please make an appointment  with your surgeon as instructed. You do not need to follow up with anesthesia unless  specifically instructed to do so.  WOUND CARE INSTRUCTIONS (if applicable):  Keep a dry clean dressing on the anesthesia/puncture wound site if there is drainage.  Once the wound has quit draining you may leave it open to air.  Generally you should leave the bandage intact for twenty four hours unless there is drainage.  If the epidural site drains for more than 36-48 hours please call the anesthesia department.  QUESTIONS?:  Please feel free to call your physician or the hospital operator if you have any questions, and they will be happy to assist you.

## 2013-12-13 NOTE — Pre-Procedure Instructions (Signed)
[  patient given information to sign up for my chart at home.

## 2013-12-18 ENCOUNTER — Other Ambulatory Visit: Payer: Self-pay | Admitting: Obstetrics & Gynecology

## 2013-12-19 ENCOUNTER — Ambulatory Visit (HOSPITAL_COMMUNITY)
Admission: RE | Admit: 2013-12-19 | Discharge: 2013-12-19 | Disposition: A | Discharge status: Home / Self Care | Payer: BC Managed Care – PPO | Source: Ambulatory Visit | Attending: Obstetrics & Gynecology | Admitting: Obstetrics & Gynecology

## 2013-12-19 ENCOUNTER — Encounter (HOSPITAL_COMMUNITY)
Admission: RE | Disposition: A | Discharge status: Home / Self Care | Payer: Self-pay | Source: Ambulatory Visit | Attending: Obstetrics & Gynecology

## 2013-12-19 ENCOUNTER — Encounter (HOSPITAL_COMMUNITY): Payer: BC Managed Care – PPO | Admitting: Anesthesiology

## 2013-12-19 ENCOUNTER — Encounter (HOSPITAL_COMMUNITY): Payer: Self-pay | Admitting: *Deleted

## 2013-12-19 ENCOUNTER — Ambulatory Visit (HOSPITAL_COMMUNITY): Payer: BC Managed Care – PPO | Admitting: Anesthesiology

## 2013-12-19 DIAGNOSIS — N920 Excessive and frequent menstruation with regular cycle: Secondary | ICD-10-CM

## 2013-12-19 DIAGNOSIS — Z302 Encounter for sterilization: Secondary | ICD-10-CM | POA: Insufficient documentation

## 2013-12-19 DIAGNOSIS — Z9889 Other specified postprocedural states: Secondary | ICD-10-CM

## 2013-12-19 DIAGNOSIS — Z4009 Encounter for prophylactic removal of other organ: Secondary | ICD-10-CM | POA: Insufficient documentation

## 2013-12-19 DIAGNOSIS — F411 Generalized anxiety disorder: Secondary | ICD-10-CM | POA: Insufficient documentation

## 2013-12-19 DIAGNOSIS — Z885 Allergy status to narcotic agent status: Secondary | ICD-10-CM | POA: Insufficient documentation

## 2013-12-19 DIAGNOSIS — N946 Dysmenorrhea, unspecified: Secondary | ICD-10-CM | POA: Insufficient documentation

## 2013-12-19 DIAGNOSIS — F3289 Other specified depressive episodes: Secondary | ICD-10-CM | POA: Insufficient documentation

## 2013-12-19 DIAGNOSIS — B977 Papillomavirus as the cause of diseases classified elsewhere: Secondary | ICD-10-CM | POA: Insufficient documentation

## 2013-12-19 DIAGNOSIS — F329 Major depressive disorder, single episode, unspecified: Secondary | ICD-10-CM | POA: Insufficient documentation

## 2013-12-19 DIAGNOSIS — Z881 Allergy status to other antibiotic agents status: Secondary | ICD-10-CM | POA: Insufficient documentation

## 2013-12-19 HISTORY — PX: LAPAROSCOPIC BILATERAL SALPINGECTOMY: SHX5889

## 2013-12-19 HISTORY — PX: DILITATION & CURRETTAGE/HYSTROSCOPY WITH THERMACHOICE ABLATION: SHX5569

## 2013-12-19 SURGERY — DILATATION & CURETTAGE/HYSTEROSCOPY WITH THERMACHOICE ABLATION
Anesthesia: General | Site: Uterus

## 2013-12-19 MED ORDER — SODIUM CHLORIDE 0.9 % IR SOLN
Status: DC | PRN
  Administered 2013-12-19 (×2): 1000 mL

## 2013-12-19 MED ORDER — CEFAZOLIN SODIUM-DEXTROSE 2-3 GM-% IV SOLR
2.0000 g | INTRAVENOUS | Status: AC
  Administered 2013-12-19: 2 g via INTRAVENOUS

## 2013-12-19 MED ORDER — FENTANYL CITRATE 0.05 MG/ML IJ SOLN
25.0000 ug | INTRAMUSCULAR | Status: DC | PRN
  Administered 2013-12-19 (×3): 50 ug via INTRAVENOUS

## 2013-12-19 MED ORDER — FENTANYL CITRATE 0.05 MG/ML IJ SOLN
INTRAMUSCULAR | Status: DC | PRN
  Administered 2013-12-19: 50 ug via INTRAVENOUS
  Administered 2013-12-19 (×2): 100 ug via INTRAVENOUS

## 2013-12-19 MED ORDER — MIDAZOLAM HCL 2 MG/2ML IJ SOLN
1.0000 mg | INTRAMUSCULAR | Status: DC | PRN
  Administered 2013-12-19: 2 mg via INTRAVENOUS

## 2013-12-19 MED ORDER — MIDAZOLAM HCL 2 MG/2ML IJ SOLN
INTRAMUSCULAR | Status: AC
  Filled 2013-12-19: qty 2

## 2013-12-19 MED ORDER — ROCURONIUM BROMIDE 50 MG/5ML IV SOLN
INTRAVENOUS | Status: AC
  Filled 2013-12-19: qty 1

## 2013-12-19 MED ORDER — ONDANSETRON HCL 4 MG/2ML IJ SOLN
4.0000 mg | Freq: Once | INTRAMUSCULAR | Status: AC
Start: 2013-12-19 — End: 2013-12-19
  Administered 2013-12-19: 4 mg via INTRAVENOUS

## 2013-12-19 MED ORDER — BUPIVACAINE LIPOSOME 1.3 % IJ SUSP
INTRAMUSCULAR | Status: DC | PRN
  Administered 2013-12-19: 20 mL

## 2013-12-19 MED ORDER — MIDAZOLAM HCL 5 MG/5ML IJ SOLN
INTRAMUSCULAR | Status: DC | PRN
  Administered 2013-12-19: 2 mg via INTRAVENOUS

## 2013-12-19 MED ORDER — DEXAMETHASONE SODIUM PHOSPHATE 4 MG/ML IJ SOLN
INTRAMUSCULAR | Status: AC
  Filled 2013-12-19: qty 1

## 2013-12-19 MED ORDER — LIDOCAINE HCL (PF) 1 % IJ SOLN
INTRAMUSCULAR | Status: AC
  Filled 2013-12-19: qty 5

## 2013-12-19 MED ORDER — DEXAMETHASONE SODIUM PHOSPHATE 4 MG/ML IJ SOLN
INTRAMUSCULAR | Status: DC | PRN
  Administered 2013-12-19: 4 mg via INTRAVENOUS

## 2013-12-19 MED ORDER — DEXAMETHASONE SODIUM PHOSPHATE 4 MG/ML IJ SOLN
4.0000 mg | Freq: Once | INTRAMUSCULAR | Status: AC
  Administered 2013-12-19: 4 mg via INTRAVENOUS

## 2013-12-19 MED ORDER — NEOSTIGMINE METHYLSULFATE 10 MG/10ML IV SOLN
INTRAVENOUS | Status: DC | PRN
  Administered 2013-12-19: 1 mg via INTRAVENOUS

## 2013-12-19 MED ORDER — GLYCOPYRROLATE 0.2 MG/ML IJ SOLN
INTRAMUSCULAR | Status: DC | PRN
  Administered 2013-12-19 (×2): 0.2 mg via INTRAVENOUS

## 2013-12-19 MED ORDER — LACTATED RINGERS IV SOLN
INTRAVENOUS | Status: DC
  Administered 2013-12-19 (×2): via INTRAVENOUS

## 2013-12-19 MED ORDER — CEFAZOLIN SODIUM-DEXTROSE 2-3 GM-% IV SOLR
INTRAVENOUS | Status: AC
  Filled 2013-12-19: qty 50

## 2013-12-19 MED ORDER — PROPOFOL 10 MG/ML IV BOLUS
INTRAVENOUS | Status: DC | PRN
  Administered 2013-12-19: 130 mg via INTRAVENOUS

## 2013-12-19 MED ORDER — HYDROCODONE-ACETAMINOPHEN 5-325 MG PO TABS: 1.0000 | ORAL_TABLET | Freq: Four times a day (QID) | ORAL | Status: DC | PRN

## 2013-12-19 MED ORDER — BUPIVACAINE LIPOSOME 1.3 % IJ SUSP
20.0000 mL | Freq: Once | INTRAMUSCULAR | Status: DC
  Filled 2013-12-19: qty 20

## 2013-12-19 MED ORDER — FENTANYL CITRATE 0.05 MG/ML IJ SOLN
INTRAMUSCULAR | Status: AC
  Filled 2013-12-19: qty 5

## 2013-12-19 MED ORDER — GLYCOPYRROLATE 0.2 MG/ML IJ SOLN
INTRAMUSCULAR | Status: AC
  Filled 2013-12-19: qty 2

## 2013-12-19 MED ORDER — KETOROLAC TROMETHAMINE 30 MG/ML IJ SOLN
30.0000 mg | Freq: Once | INTRAMUSCULAR | Status: AC
  Administered 2013-12-19: 30 mg via INTRAVENOUS

## 2013-12-19 MED ORDER — NEOSTIGMINE METHYLSULFATE 10 MG/10ML IV SOLN
INTRAVENOUS | Status: AC
  Filled 2013-12-19: qty 1

## 2013-12-19 MED ORDER — LIDOCAINE HCL 1 % IJ SOLN
INTRAMUSCULAR | Status: DC | PRN
  Administered 2013-12-19: 50 mg via INTRADERMAL

## 2013-12-19 MED ORDER — GLYCOPYRROLATE 0.2 MG/ML IJ SOLN
INTRAMUSCULAR | Status: AC
  Filled 2013-12-19: qty 1

## 2013-12-19 MED ORDER — KETOROLAC TROMETHAMINE 30 MG/ML IJ SOLN
INTRAMUSCULAR | Status: AC
  Filled 2013-12-19: qty 1

## 2013-12-19 MED ORDER — KETOROLAC TROMETHAMINE 10 MG PO TABS: 10.0000 mg | ORAL_TABLET | Freq: Three times a day (TID) | ORAL | Status: DC | PRN

## 2013-12-19 MED ORDER — FENTANYL CITRATE 0.05 MG/ML IJ SOLN
INTRAMUSCULAR | Status: AC
  Filled 2013-12-19: qty 2

## 2013-12-19 MED ORDER — ROCURONIUM BROMIDE 100 MG/10ML IV SOLN
INTRAVENOUS | Status: DC | PRN
  Administered 2013-12-19: 35 mg via INTRAVENOUS
  Administered 2013-12-19: 5 mg via INTRAVENOUS

## 2013-12-19 MED ORDER — ONDANSETRON HCL 4 MG/2ML IJ SOLN
INTRAMUSCULAR | Status: AC
  Filled 2013-12-19: qty 2

## 2013-12-19 MED ORDER — DEXTROSE 5 % IV SOLN
INTRAVENOUS | Status: DC | PRN
  Administered 2013-12-19: 500 mL

## 2013-12-19 MED ORDER — PROPOFOL 10 MG/ML IV BOLUS
INTRAVENOUS | Status: AC
  Filled 2013-12-19: qty 20

## 2013-12-19 MED ORDER — ONDANSETRON HCL 8 MG PO TABS: 8.0000 mg | ORAL_TABLET | Freq: Three times a day (TID) | ORAL | Status: DC | PRN

## 2013-12-19 MED ORDER — ONDANSETRON HCL 4 MG/2ML IJ SOLN
4.0000 mg | Freq: Once | INTRAMUSCULAR | Status: AC | PRN
  Administered 2013-12-19: 4 mg via INTRAVENOUS

## 2013-12-19 MED ORDER — FENTANYL CITRATE 0.05 MG/ML IJ SOLN
25.0000 ug | INTRAMUSCULAR | Status: AC
  Administered 2013-12-19: 25 ug via INTRAVENOUS

## 2013-12-19 MED ORDER — GLYCOPYRROLATE 0.2 MG/ML IJ SOLN
0.2000 mg | Freq: Once | INTRAMUSCULAR | Status: AC
  Administered 2013-12-19: 0.2 mg via INTRAVENOUS

## 2013-12-19 SURGICAL SUPPLY — 66 items
BAG DECANTER FOR FLEXI CONT (MISCELLANEOUS) ×4 IMPLANT
BAG HAMPER (MISCELLANEOUS) ×4 IMPLANT
BLADE SURG SZ11 CARB STEEL (BLADE) ×4 IMPLANT
CATH THERMACHOICE III (CATHETERS) ×4 IMPLANT
CLOTH BEACON ORANGE TIMEOUT ST (SAFETY) ×4 IMPLANT
COVER LIGHT HANDLE STERIS (MISCELLANEOUS) ×8 IMPLANT
COVER MAYO STAND XLG (DRAPE) ×4 IMPLANT
DRAPE PROXIMA HALF (DRAPES) ×2 IMPLANT
DRSG TEGADERM 2-3/8X2-3/4 SM (GAUZE/BANDAGES/DRESSINGS) ×6 IMPLANT
ELECT REM PT RETURN 9FT ADLT (ELECTROSURGICAL) ×4
ELECTRODE REM PT RTRN 9FT ADLT (ELECTROSURGICAL) ×2 IMPLANT
FILTER SMOKE EVAC LAPAROSHD (FILTER) ×4 IMPLANT
FORMALIN 10 PREFIL 120ML (MISCELLANEOUS) ×4 IMPLANT
FORMALIN 10 PREFIL 480ML (MISCELLANEOUS) ×2 IMPLANT
GAUZE SPONGE 4X4 16PLY XRAY LF (GAUZE/BANDAGES/DRESSINGS) ×4 IMPLANT
GLOVE BIOGEL PI IND STRL 7.0 (GLOVE) IMPLANT
GLOVE BIOGEL PI IND STRL 7.5 (GLOVE) IMPLANT
GLOVE BIOGEL PI IND STRL 8 (GLOVE) ×2 IMPLANT
GLOVE BIOGEL PI INDICATOR 7.0 (GLOVE) ×2
GLOVE BIOGEL PI INDICATOR 7.5 (GLOVE) ×2
GLOVE BIOGEL PI INDICATOR 8 (GLOVE) ×2
GLOVE ECLIPSE 8.0 STRL XLNG CF (GLOVE) ×4 IMPLANT
GLOVE EXAM NITRILE PF LG BLUE (GLOVE) ×2 IMPLANT
GLOVE SS BIOGEL STRL SZ 6.5 (GLOVE) IMPLANT
GLOVE SUPERSENSE BIOGEL SZ 6.5 (GLOVE) ×2
GOWN STRL REUS W/TWL LRG LVL3 (GOWN DISPOSABLE) ×4 IMPLANT
GOWN STRL REUS W/TWL XL LVL3 (GOWN DISPOSABLE) ×4 IMPLANT
INST SET HYSTEROSCOPY (KITS) ×4 IMPLANT
INST SET LAPROSCOPIC GYN AP (KITS) ×4 IMPLANT
IV D5W 500ML (IV SOLUTION) ×4 IMPLANT
IV NS 1000ML (IV SOLUTION) ×4
IV NS 1000ML BAXH (IV SOLUTION) ×2 IMPLANT
IV NS IRRIG 3000ML ARTHROMATIC (IV SOLUTION) ×2 IMPLANT
KIT ROOM TURNOVER AP CYSTO (KITS) ×4 IMPLANT
KIT ROOM TURNOVER APOR (KITS) ×4 IMPLANT
MANIFOLD NEPTUNE II (INSTRUMENTS) ×4 IMPLANT
MARKER SKIN DUAL TIP RULER LAB (MISCELLANEOUS) ×4 IMPLANT
NDL HYPO 18GX1.5 BLUNT FILL (NEEDLE) ×2 IMPLANT
NDL HYPO 25X1 1.5 SAFETY (NEEDLE) ×2 IMPLANT
NEEDLE HYPO 18GX1.5 BLUNT FILL (NEEDLE) ×4 IMPLANT
NEEDLE HYPO 25X1 1.5 SAFETY (NEEDLE) ×4 IMPLANT
NS IRRIG 1000ML POUR BTL (IV SOLUTION) ×4 IMPLANT
PACK BASIC III (CUSTOM PROCEDURE TRAY) ×4
PACK PERI GYN (CUSTOM PROCEDURE TRAY) ×4 IMPLANT
PACK SRG BSC III STRL LF ECLPS (CUSTOM PROCEDURE TRAY) ×2 IMPLANT
PAD ARMBOARD 7.5X6 YLW CONV (MISCELLANEOUS) ×4 IMPLANT
PAD TELFA 3X4 1S STER (GAUZE/BANDAGES/DRESSINGS) ×4 IMPLANT
SCALPEL HARMONIC ACE (MISCELLANEOUS) ×4 IMPLANT
SET BASIN LINEN APH (SET/KITS/TRAYS/PACK) ×4 IMPLANT
SET IRRIG Y TYPE TUR BLADDER L (SET/KITS/TRAYS/PACK) ×4 IMPLANT
SET TUBE IRRIG SUCTION NO TIP (IRRIGATION / IRRIGATOR) ×4 IMPLANT
SHEET LAVH (DRAPES) ×4 IMPLANT
SOLUTION ANTI FOG 6CC (MISCELLANEOUS) ×4 IMPLANT
SPONGE GAUZE 2X2 8PLY STER LF (GAUZE/BANDAGES/DRESSINGS) ×3
SPONGE GAUZE 2X2 8PLY STRL LF (GAUZE/BANDAGES/DRESSINGS) ×3 IMPLANT
STAPLER VISISTAT 35W (STAPLE) ×4 IMPLANT
SUT VICRYL 0 UR6 27IN ABS (SUTURE) ×4 IMPLANT
SYR 20CC LL (SYRINGE) ×4 IMPLANT
TAPE CLOTH SURG 4X10 WHT LF (GAUZE/BANDAGES/DRESSINGS) ×2 IMPLANT
TRAY FOLEY CATH 16FR SILVER (SET/KITS/TRAYS/PACK) ×2 IMPLANT
TROCAR ENDO BLADELESS 11MM (ENDOMECHANICALS) ×4 IMPLANT
TROCAR SLEEVE XCEL 5X75 (ENDOMECHANICALS) ×4 IMPLANT
TROCAR XCEL NON-BLD 5MMX100MML (ENDOMECHANICALS) ×4 IMPLANT
TUBING INSUFFLATION (TUBING) ×4 IMPLANT
WARMER LAPAROSCOPE (MISCELLANEOUS) ×4 IMPLANT
YANKAUER SUCT BULB TIP 10FT TU (MISCELLANEOUS) ×4 IMPLANT

## 2013-12-19 NOTE — Transfer of Care (Signed)
Immediate Anesthesia Transfer of Care Note  Patient: Deborah Barr  Procedure(s) Performed: Procedure(s) with comments: DILATATION & CURETTAGE/HYSTEROSCOPY WITH THERMACHOICE ABLATION (N/A) -  20  ml in    20  ml out temp 88 degrees celcius total therapy time 48min 15 seconds LAPAROSCOPIC BILATERAL SALPINGECTOMY (Bilateral)  Patient Location: PACU  Anesthesia Type:General  Level of Consciousness: awake and patient cooperative  Airway & Oxygen Therapy: Patient Spontanous Breathing and Patient connected to face mask oxygen  Post-op Assessment: Report given to PACU RN, Post -op Vital signs reviewed and stable and Patient moving all extremities  Post vital signs: Reviewed and stable  Complications: No apparent anesthesia complications

## 2013-12-19 NOTE — Anesthesia Postprocedure Evaluation (Signed)
  Anesthesia Post-op Note  Patient: Deborah Barr  Procedure(s) Performed: Procedure(s) with comments: DILATATION & CURETTAGE/HYSTEROSCOPY WITH THERMACHOICE ABLATION (N/A) -  20  ml in    20  ml out temp 88 degrees celcius total therapy time 57min 15 seconds LAPAROSCOPIC BILATERAL SALPINGECTOMY (Bilateral)  Patient Location: PACU  Anesthesia Type:General  Level of Consciousness: awake, alert , oriented and patient cooperative  Airway and Oxygen Therapy: Patient Spontanous Breathing  Post-op Pain: 3 /10, mild  Post-op Assessment: Post-op Vital signs reviewed, Patient's Cardiovascular Status Stable, Respiratory Function Stable and Patent Airway  Post-op Vital Signs: Reviewed and stable  Last Vitals:  Filed Vitals:   12/19/13 0727  BP: 108/62  Pulse: 79  Temp: 40.9 C    Complications: No apparent anesthesia complications

## 2013-12-19 NOTE — Anesthesia Preprocedure Evaluation (Signed)
Anesthesia Evaluation  Patient identified by MRN, date of birth, ID band Patient awake    Reviewed: Allergy & Precautions, H&P , Patient's Chart, lab work & pertinent test results  History of Anesthesia Complications Negative for: history of anesthetic complications  Airway Mallampati: I TM Distance: >3 FB     Dental  (+) Teeth Intact   Pulmonary neg pulmonary ROS,  breath sounds clear to auscultation        Cardiovascular negative cardio ROS  Rhythm:regular Rate:Normal     Neuro/Psych Seizures -, Well Controlled,  PSYCHIATRIC DISORDERS Anxiety Depression negative neurological ROS  negative psych ROS   GI/Hepatic negative GI ROS, Neg liver ROS,   Endo/Other  Morbid obesity  Renal/GU negative Renal ROS     Musculoskeletal   Abdominal   Peds  Hematology negative hematology ROS (+)   Anesthesia Other Findings   Reproductive/Obstetrics                           Anesthesia Physical Anesthesia Plan  ASA: II  Anesthesia Plan: General   Post-op Pain Management:    Induction: Intravenous  Airway Management Planned: Oral ETT  Additional Equipment:   Intra-op Plan:   Post-operative Plan: Extubation in OR  Informed Consent: I have reviewed the patients History and Physical, chart, labs and discussed the procedure including the risks, benefits and alternatives for the proposed anesthesia with the patient or authorized representative who has indicated his/her understanding and acceptance.     Plan Discussed with:   Anesthesia Plan Comments:         Anesthesia Quick Evaluation

## 2013-12-19 NOTE — H&P (Signed)
Preoperative History and Physical  Deborah Barr is a 33 y.o. [id number] with Patient's last menstrual period was 11/19/2013. admitted for a Lap salpingectomy for sterilization and hysteroscopy uterine curettage and thermachoice endometrial ablation Pt wants a laparoscopic tub al, selects complete removal and management of her periods with endometrial ablation  Sonogram is normal, reviewed  Scheduled 12/19/2013  Megestrol pre op   PMH:    Past Medical History  Diagnosis Date  . History of cholecystectomy 2012    at 16 weeks  . 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 HPV (human papilloma virus) anogenital infection   . HPV (human papilloma virus) infection   . Abnormal Pap smear   . Seasonal allergies   . Recurrent sinus infections   . Gallstone   . Vaginal Pap smear, abnormal   . Boil of buttock 10/30/2013  . Menorrhagia 10/30/2013  . Dysmenorrhea 10/30/2013  . Anxiety   . Depression   . Seizures     had 1 seizure 15 years ago frim increase of adrenoline- none since and no meds    PSH:     Past Surgical History  Procedure Laterality Date  . Nasal septum surgery  2011  . Laparoscopic cholecystectomy  09/04/10    Dr. Ander Gaster performed at [redacted] weeks gestation  . Eye muscle surgery      age 60  . Tympanoplasty      childhood  . Leep  2001    [facility]  . Cholecystectomy  2012  . Eye muscle surgery      as child  . Leep      POb/GynH:      OB History   Grav Para Term Preterm Abortions TAB SAB Ect Mult Living   3 2 1  0 1 0 1 0 0 2      SH:   History  Substance Use Topics  . Smoking status: Never Smoker   . Smokeless tobacco: Never Used     Comment: never used snuff or chewing tobacco.  . Alcohol Use: No    FH:    Family History  Problem Relation Age of Onset  . Heart disease Father   . Hypertension Father      Allergies:  Allergies  Allergen Reactions  . Codeine Nausea And Vomiting  . Neosporin [Neomycin-Polymyxin-Gramicidin] Rash     Medications:      Current facility-administered medications:bupivacaine liposome (EXPAREL) 1.3 % injection 266 mg, 20 mL, Infiltration, Once, Florian Buff, MD;  ceFAZolin (ANCEF) IVPB 2 g/50 mL premix, 2 g, Intravenous, On Call to OR, Florian Buff, MD;  fentaNYL (SUBLIMAZE) injection 25 mcg, 25 mcg, Intravenous, Q10 min, Lerry Liner, MD, 25 mcg at 12/19/13 0805 lactated ringers infusion, , Intravenous, Continuous, Lerry Liner, MD, Last Rate: 75 mL/hr at 12/19/13 0754;  midazolam (VERSED) injection 1-2 mg, 1-2 mg, Intravenous, Q5 Min x 3 PRN, Lerry Liner, MD, 2 mg at 12/19/13 2423  Review of Systems:   Review of Systems  Constitutional: Negative for fever, chills, weight loss, malaise/fatigue and diaphoresis.  HENT: Negative for hearing loss, ear pain, nosebleeds, congestion, sore throat, neck pain, tinnitus and ear discharge.   Eyes: Negative for blurred vision, double vision, photophobia, pain, discharge and redness.  Respiratory: Negative for cough, hemoptysis, sputum production, shortness of breath, wheezing and stridor.   Cardiovascular: Negative for chest pain, palpitations, orthopnea, claudication, leg swelling and PND.  Gastrointestinal: Positive for abdominal pain. Negative for heartburn, nausea, vomiting,  diarrhea, constipation, blood in stool and melena.  Genitourinary: Negative for dysuria, urgency, frequency, hematuria and flank pain.  Musculoskeletal: Negative for myalgias, back pain, joint pain and falls.  Skin: Negative for itching and rash.  Neurological: Negative for dizziness, tingling, tremors, sensory change, speech change, focal weakness, seizures, loss of consciousness, weakness and headaches.  Endo/Heme/Allergies: Negative for environmental allergies and polydipsia. Does not bruise/bleed easily.  Psychiatric/Behavioral: Negative for depression, suicidal ideas, hallucinations, memory loss and substance abuse. The patient is not nervous/anxious and does not have  insomnia.      PHYSICAL EXAM:  Blood pressure 108/62, pulse 79, temperature 98.3 F (36.8 C), temperature source Oral, last menstrual period 11/19/2013, SpO2 100.00%.    Vitals reviewed. Constitutional: She is oriented to person, place, and time. She appears well-developed and well-nourished.  HENT:  Head: Normocephalic and atraumatic.  Right Ear: External ear normal.  Left Ear: External ear normal.  Nose: Nose normal.  Mouth/Throat: Oropharynx is clear and moist.  Eyes: Conjunctivae and EOM are normal. Pupils are equal, round, and reactive to light. Right eye exhibits no discharge. Left eye exhibits no discharge. No scleral icterus.  Neck: Normal range of motion. Neck supple. No tracheal deviation present. No thyromegaly present.  Cardiovascular: Normal rate, regular rhythm, normal heart sounds and intact distal pulses.  Exam reveals no gallop and no friction rub.   No murmur heard. Respiratory: Effort normal and breath sounds normal. No respiratory distress. She has no wheezes. She has no rales. She exhibits no tenderness.  GI: Soft. Bowel sounds are normal. She exhibits no distension and no mass. There is tenderness. There is no rebound and no guarding.  Genitourinary:       Vulva is normal without lesions Vagina is pink moist without discharge Cervix normal in appearance and pap is normal Uterus is normal by sonogram Adnexa is negative with normal sized ovaries by sonogram  Musculoskeletal: Normal range of motion. She exhibits no edema and no tenderness.  Neurological: She is alert and oriented to person, place, and time. She has normal reflexes. She displays normal reflexes. No cranial nerve deficit. She exhibits normal muscle tone. Coordination normal.  Skin: Skin is warm and dry. No rash noted. No erythema. No pallor.  Psychiatric: She has a normal mood and affect. Her behavior is normal. Judgment and thought content normal.    Labs: Results for orders placed during the  hospital encounter of 12/13/13 (from the past 336 hour(s))  URINALYSIS, ROUTINE W REFLEX MICROSCOPIC   Collection Time    12/13/13 10:18 AM      Result Value Ref Range   Color, Urine YELLOW  YELLOW   APPearance CLEAR  CLEAR   Specific Gravity, Urine >1.030 (*) 1.005 - 1.030   pH 6.0  5.0 - 8.0   Glucose, UA NEGATIVE  NEGATIVE mg/dL   Hgb urine dipstick NEGATIVE  NEGATIVE   Bilirubin Urine NEGATIVE  NEGATIVE   Ketones, ur NEGATIVE  NEGATIVE mg/dL   Protein, ur NEGATIVE  NEGATIVE mg/dL   Urobilinogen, UA 0.2  0.0 - 1.0 mg/dL   Nitrite NEGATIVE  NEGATIVE   Leukocytes, UA NEGATIVE  NEGATIVE  CBC   Collection Time    12/13/13 10:20 AM      Result Value Ref Range   WBC 7.4  4.0 - 10.5 K/uL   RBC 4.32  3.87 - 5.11 MIL/uL   Hemoglobin 12.7  12.0 - 15.0 g/dL   HCT 37.7  36.0 - 46.0 %   MCV 87.3  78.0 - 100.0 fL   MCH 29.4  26.0 - 34.0 pg   MCHC 33.7  30.0 - 36.0 g/dL   RDW 12.8  11.5 - 15.5 %   Platelets 232  150 - 400 K/uL  COMPREHENSIVE METABOLIC PANEL   Collection Time    12/13/13 10:20 AM      Result Value Ref Range   Sodium 142  137 - 147 mEq/L   Potassium 4.0  3.7 - 5.3 mEq/L   Chloride 107  96 - 112 mEq/L   CO2 25  19 - 32 mEq/L   Glucose, Bld 103 (*) 70 - 99 mg/dL   BUN 10  6 - 23 mg/dL   Creatinine, Ser 0.84  0.50 - 1.10 mg/dL   Calcium 9.3  8.4 - 10.5 mg/dL   Total Protein 6.9  6.0 - 8.3 g/dL   Albumin 3.8  3.5 - 5.2 g/dL   AST 15  0 - 37 U/L   ALT 11  0 - 35 U/L   Alkaline Phosphatase 75  39 - 117 U/L   Total Bilirubin 0.4  0.3 - 1.2 mg/dL   GFR calc non Af Amer 90 (*) >90 mL/min   GFR calc Af Amer >90  >90 mL/min  HCG, QUANTITATIVE, PREGNANCY   Collection Time    12/13/13 10:20 AM      Result Value Ref Range   hCG, Beta Chain, Quant, S <1  <5 mIU/mL    EKG: No orders found for this or any previous visit.  Imaging Studies: SHAYLYN BAWA is a 33 y.o. [id number] LMP 10/25/2013 for a pelvic sonogram for menorrhagia and dysmenorrhea.  Uterus 7.9 x 5.6 x 3.5  cm, Retroverted uterus no myometrial masses noted  Endometrium 6.9 mm, symmetrical,  Right ovary 2.9 x 2.4 x 2.2 cm, with dominant follicle noted  Left ovary 2.6 x 1.6 x 1.5 cm,  No adnexal masses or free fluid noted within the pelvis  Technician Comments:  Retroverted uterus noted, Endom-6.28mm symmetrical, bilateral adnexa appears WNL no adnexal masses or free fluid noted within  Lazarus Gowda  11/12/2013  10:44 AM  Clinical Impression and recommendations:  I have reviewed the sonogram results above, combined with the patient's current clinical course, below are my impressions and any appropriate recommendations for management based on the sonographic findings.  Normal pelvic anatomy  No anatomical explanation for the patient's symptoms  Florian Buff  11/14/2013  9:34 PM       Result History       Assessment:desires sterilization, chooses salpingectomy Patient Active Problem List   Diagnosis Date Noted  . Boil of buttock 10/30/2013  . Menorrhagia 10/30/2013  . Dysmenorrhea 10/30/2013    Plan: As above, laparoscopic salpingectomy and endometrial ablation hysteroscopic and uterine curettage  Florian Buff 12/19/2013 8:11 AM

## 2013-12-19 NOTE — Discharge Instructions (Signed)
Laparoscopic Tubal Ligation °Care After °Refer to this sheet in the next few weeks. These instructions provide you with information on caring for yourself after your procedure. Your caregiver may also give you more specific instructions. Your treatment has been planned according to current medical practices, but problems sometimes occur. Call your caregiver if you have any problems or questions after your procedure. °HOME CARE INSTRUCTIONS  °· Rest the remainder of the day. °· Only take over-the-counter or prescription medicines for pain, discomfort, or fever as directed by your caregiver. Do not take aspirin. It can cause bleeding. °· Gradually resume daily activities, diet, rest, driving, and work. °· Avoid sexual intercourse for 2 weeks or as directed. °· Do not use tampons or douche. °· Do not drive while taking pain medicine. °· Do not lift anything over 5 pounds for 2 weeks or as directed. °· Only take showers, not baths, until you are seen by your caregiver. °· Change bandages (dressings) as directed. °· Take your temperature twice a day and record it. °· Try to have help for the first 7 to 10 days for your household needs. °· Return to your caregiver to get your stitches (sutures) removed and for follow-up visits as directed. °SEEK MEDICAL CARE IF:  °· You have redness, swelling, or increasing pain in a wound. °· You have drainage from a wound lasting longer than 1 day. °· Your pain is getting worse. °· You have a rash. °· You become dizzy or lightheaded. °· You have a reaction to your medicine. °· You need stronger medicine or a change in your pain medicine. °· You notice a bad smell coming from a wound or dressing. °· Your wound breaks open after the sutures have been removed. °· You are constipated. °SEEK IMMEDIATE MEDICAL CARE IF:  °· You faint. °· You have a fever. °· You have increasing abdominal pain. °· You have severe pain in your shoulders. °· You have bleeding or drainage from the suture sites or  vagina following surgery. °· You have shortness of breath or difficulty breathing. °· You have chest or leg pain. °· You have persistent nausea, vomiting, or diarrhea. °MAKE SURE YOU:  °· Understand these instructions. °· Watch your condition. °· Get help right away if you are not doing well or get worse. °Document Released: 02/12/2005 Document Revised: 01/25/2012 Document Reviewed: 11/06/2011 °ExitCare® Patient Information ©2014 [employer]. °Endometrial Ablation °Endometrial ablation removes the lining of the uterus (endometrium). It is usually a same-day, outpatient treatment. Ablation helps avoid major surgery, such as surgery to remove the cervix and uterus (hysterectomy). After endometrial ablation, you will have little or no menstrual bleeding and may not be able to have children. However, if you are premenopausal, you will need to use a reliable method of birth control following the procedure because of the small chance that pregnancy can occur. °There are different reasons to have this procedure, which include: °· Heavy periods. °· Bleeding that is causing anemia. °· Irregular bleeding. °· Bleeding fibroids on the lining inside the uterus if they are smaller than 3 centimeters. °This procedure should not be done if: °· You want children in the future. °· You have severe cramps with your menstrual period. °· You have precancerous or cancerous cells in your uterus. °· You were recently pregnant. °· You have gone through menopause. °· You have had major surgery on the uterus, such as a cesarean delivery. °LET YOUR HEALTH CARE PROVIDER KNOW ABOUT: °· Any allergies you have. °· All   medicines you are taking, including vitamins, herbs, eye drops, creams, and over-the-counter medicines. °· Previous problems you or members of your family have had with the use of anesthetics. °· Any blood disorders you have. °· Previous surgeries you have had. °· Medical conditions you have. °RISKS AND COMPLICATIONS  °Generally,  this is a safe procedure. However, as with any procedure, complications can occur. Possible complications include: °· Perforation of the uterus. °· Bleeding. °· Infection of the uterus, bladder, or vagina. °· Injury to surrounding organs. °· An air bubble to the lung (air embolus). °· Pregnancy following the procedure. °· Failure of the procedure to help the problem, requiring hysterectomy. °· Decreased ability to diagnose cancer in the lining of the uterus. °BEFORE THE PROCEDURE °· The lining of the uterus must be tested to make sure there is no pre-cancerous or cancer cells present. °· An ultrasound may be performed to look at the size of the uterus and to check for abnormalities. °· Medicines may be given to thin the lining of the uterus. °PROCEDURE  °During the procedure, your health care provider will use a tool called a resectoscope to help see inside your uterus. There are different ways to remove the lining of your uterus.  °· Radiofrequency  This method uses a radiofrequency-alternating electric current to remove the lining of the uterus. °· Cryotherapy This method uses extreme cold to freeze the lining of the uterus. °· Heated-Free Liquid  This method uses heated salt (saline) solution to remove the lining of the uterus. °· Microwave This method uses high-energy microwaves to heat up the lining of the uterus to remove it. °· Thermal balloon  This method involves inserting a catheter with a balloon tip into the uterus. The balloon tip is filled with heated fluid to remove the lining of the uterus. °AFTER THE PROCEDURE  °After your procedure, do not have sexual intercourse or insert anything into your vagina until permitted by your health care provider. After the procedure, you may experience: °· Cramps. °· Vaginal discharge. °· Frequent urination. °Document Released: 06/04/2004 Document Revised: 03/28/2013 Document Reviewed: 12/27/2012 °ExitCare® Patient Information ©2014 [employer]. ° °

## 2013-12-19 NOTE — Op Note (Signed)
Preoperative Diagnosis:  1.  Multiparous female desires permanent sterilization                                          2.  Elects to have bilateral salpingectomy for ovarian cancer prophylaxis  Postoperative Diagnosis:  Same as above  Procedure:  Laparoscopic Bilateral Salpingectomy  Surgeon:  Jacelyn Grip MD  Anaesthesia: general  Findings:  Patient had normal pelvic anatomy and no intraperitoneal abnormalities.  Description of Operation:  Patient was taken to the OR and placed into supine position where she underwent general anaesthesia.  She was placed in the dorsal lithotomy position and prepped and draped in the usual sterile fashion.  An incision was made in the umbilicus and dissection taken down to the rectus fascia which was incised and opened.  The non bladed trocar was then placed and the peritoneal cavity was insufflated.  The above noted findings were observed.  Additional trocars were placed in the right and left lower quadrants under direct visualization without difficulty.  The Harmonic scalpel was employed and salpingectomy of both the right and left tubes was performed.   The tubes were removed from the peritoneal cavity and sent to pathology.  There was good hemostasis bilaterally.  The fascia, peritoneum and subcutaneous tissue were closed using 0 vicryl.  The skin was closed using staples.  Exparel 266 mg 20 cc was injected in the 3 incision trocar sites. The patient was awakened from anaesthesia and taken to the PACU with all counts being correct x 3.  The patient received 2 gram of ancef andToradol 30 mg IV preoperatively.  Preoperative diagnosis: Menometrorrhagia                                        Dysmenorrhea   Postoperative diagnoses: Same as above   Procedure: Hysteroscopy, uterine curettage, endometrial ablation  Surgeon: Elonda Husky MD  Anesthesia: Laryngeal mask airway  Findings: The endometrium was normal. There were no fibroid or other  abnormalities.  Description of operation: The patient was taken to the operating room and placed in the supine position. She underwent general anesthesia using the laryngeal mask airway. She was placed in the dorsal lithotomy position and prepped and draped in the usual sterile fashion. A Graves speculum was placed and the anterior cervical lip was grasped with a single-tooth tenaculum. The cervix was dilated serially to allow passage of the hysteroscope. Diagnostic hysteroscopy was performed and was found to be normal. A vigorous uterine curettage was then performed and all tissue sent to pathology for evaluation. The ThermaChoice 3 endometrial ablation balloon was then used were 20 cc of D5W was required to maintain a pressure of 190-200 mm of mercury throughout the procedure. Toatl therapy time was 10:52.  All of the equipment worked well throughout the procedure. All of the fluid was returned at the end of the procedure. The patient was awakened from anesthesia and taken to the recovery room in good stable condition all counts were correct. She received 2 g of Ancef and 30 mg of Toradol preoperatively. She will be discharged from the recovery room and followed up in the office in 1- 2 weeks.     Deborah Barr 12/19/2013 10:04 AM

## 2013-12-19 NOTE — Anesthesia Procedure Notes (Signed)
Procedure Name: Intubation Date/Time: 12/19/2013 8:51 AM Performed by: Charmaine Downs Pre-anesthesia Checklist: Patient being monitored, Suction available, Emergency Drugs available and Patient identified Patient Re-evaluated:Patient Re-evaluated prior to inductionOxygen Delivery Method: Circle system utilized Preoxygenation: Pre-oxygenation with 100% oxygen Intubation Type: IV induction Ventilation: Mask ventilation without difficulty and Oral airway inserted - appropriate to patient size Laryngoscope Size: Mac and 3 Grade View: Grade I Tube type: Oral Tube size: 6.0 mm Number of attempts: 2 Placement Confirmation: ETT inserted through vocal cords under direct vision,  positive ETCO2 and breath sounds checked- equal and bilateral Secured at: 22 cm Tube secured with: Tape Dental Injury: Teeth and Oropharynx as per pre-operative assessment

## 2013-12-21 ENCOUNTER — Encounter (HOSPITAL_COMMUNITY): Payer: Self-pay | Admitting: Obstetrics & Gynecology

## 2013-12-21 DIAGNOSIS — Z029 Encounter for administrative examinations, unspecified: Secondary | ICD-10-CM

## 2013-12-24 ENCOUNTER — Telehealth: Payer: Self-pay | Admitting: Obstetrics & Gynecology

## 2013-12-24 NOTE — Telephone Encounter (Signed)
Pt states had endometrial ablation on 12/19/2013 started having a light bleeding yesterday, c/o muscular pain and light cramping, no fever, throat still a little sore from intubation. Pt states both her children have strep and she is reluctant to come into office today. Informed pt can be normal to have light bleeding, continue to monitor if worsens or no improvement call our office back. Pt has post op appt this Wednesday with Dr. Elonda Husky.

## 2013-12-26 ENCOUNTER — Ambulatory Visit (INDEPENDENT_AMBULATORY_CARE_PROVIDER_SITE_OTHER): Payer: Self-pay | Admitting: Obstetrics & Gynecology

## 2013-12-26 ENCOUNTER — Encounter: Payer: Self-pay | Admitting: Obstetrics & Gynecology

## 2013-12-26 VITALS — BP 110/80 | Wt 190.0 lb

## 2013-12-26 DIAGNOSIS — Z9889 Other specified postprocedural states: Secondary | ICD-10-CM

## 2013-12-26 NOTE — Progress Notes (Signed)
Patient ID: Deborah Barr, female   DOB: August 14, 1980, 33 y.o.   MRN: [id number] POD #7 laparoscopic salpingectomy for sterilization and endometrial ablation No complications in surgery No complaints today  ROS No burning with urination, frequency or urgency No nausea, vomiting or diarrhea Nor fever chills or other constitutional symptoms  Exam inicisons clean dry intact x3 all staples removed Abdomen is benign soft normal post op  Impression Normal post operative course  Plan Follow up  As needed or 1 year/yearly  Past Medical History  Diagnosis Date  . History of cholecystectomy 2012    at 16 weeks  . 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 HPV (human papilloma virus) anogenital infection   . HPV (human papilloma virus) infection   . Abnormal Pap smear   . Seasonal allergies   . Recurrent sinus infections   . Gallstone   . Vaginal Pap smear, abnormal   . Boil of buttock 10/30/2013  . Menorrhagia 10/30/2013  . Dysmenorrhea 10/30/2013  . Anxiety   . Depression   . Seizures     had 1 seizure 15 years ago frim increase of adrenoline- none since and no meds    Past Surgical History  Procedure Laterality Date  . Nasal septum surgery  2011  . Laparoscopic cholecystectomy  09/04/10    Dr. Ander Gaster performed at [redacted] weeks gestation  . Eye muscle surgery      age 26  . Tympanoplasty      childhood  . Leep  2001    [facility]  . Cholecystectomy  2012  . Eye muscle surgery      as child  . Leep    . Dilitation & currettage/hystroscopy with thermachoice ablation N/A 12/19/2013    Procedure: DILATATION & CURETTAGE/HYSTEROSCOPY WITH THERMACHOICE ABLATION;  Surgeon: Florian Buff, MD;  Location: [facility];  Service: Gynecology;  Laterality: N/A;   20  ml in    20  ml out temp 88 degrees celcius total therapy time 53min 15 seconds  . Laparoscopic bilateral salpingectomy Bilateral 12/19/2013    Procedure: LAPAROSCOPIC BILATERAL SALPINGECTOMY;  Surgeon: Florian Buff,  MD;  Location: [facility];  Service: Gynecology;  Laterality: Bilateral;    OB History   Grav Para Term Preterm Abortions TAB SAB Ect Mult Living   3 2 1  0 1 0 1 0 0 2      Allergies  Allergen Reactions  . Codeine Nausea And Vomiting  . Neosporin [Neomycin-Polymyxin-Gramicidin] Rash    History   Social History  . Marital Status: Married    Spouse Name: N/A    Number of Children: 1  . Years of Education: 12   Occupational History  . homemaker    Social History Main Topics  . Smoking status: Never Smoker   . Smokeless tobacco: Never Used     Comment: never used snuff or chewing tobacco.  . Alcohol Use: No  . Drug Use: No  . Sexual Activity: Yes    Birth Control/ Protection: None, Condom   Other Topics Concern  . None   Social History Narrative  . None    Family History  Problem Relation Age of Onset  . Heart disease Father   . Hypertension Father

## 2014-05-16 ENCOUNTER — Telehealth: Payer: Self-pay | Admitting: Adult Health

## 2014-05-16 NOTE — Telephone Encounter (Signed)
For about 2 months has had hair loss and not sleeping and had weight gain was on Qysmia but stopped is taking lexapro now, had TSh 0.955, but worried about hair, call dermatologist

## 2014-06-10 ENCOUNTER — Encounter: Payer: Self-pay | Admitting: Obstetrics & Gynecology

## 2014-08-12 ENCOUNTER — Other Ambulatory Visit: Payer: Self-pay | Admitting: Obstetrics & Gynecology

## 2015-04-19 ENCOUNTER — Other Ambulatory Visit: Payer: Self-pay | Admitting: Obstetrics & Gynecology

## 2016-02-04 ENCOUNTER — Other Ambulatory Visit: Payer: Self-pay | Admitting: Obstetrics & Gynecology

## 2016-10-18 ENCOUNTER — Other Ambulatory Visit: Payer: Self-pay | Admitting: Obstetrics & Gynecology

## 2016-12-10 ENCOUNTER — Telehealth: Payer: Self-pay | Admitting: *Deleted

## 2016-12-10 NOTE — Telephone Encounter (Signed)
Left message I called 

## 2016-12-13 NOTE — Telephone Encounter (Signed)
Pt has breast lump and other issue, has Family planning medicaid, make appt

## 2016-12-28 ENCOUNTER — Ambulatory Visit: Payer: Self-pay | Admitting: Adult Health

## 2017-01-13 ENCOUNTER — Ambulatory Visit (INDEPENDENT_AMBULATORY_CARE_PROVIDER_SITE_OTHER): Payer: Medicaid Other | Admitting: Adult Health

## 2017-01-13 ENCOUNTER — Encounter: Payer: Self-pay | Admitting: Adult Health

## 2017-01-13 ENCOUNTER — Other Ambulatory Visit (HOSPITAL_COMMUNITY)
Admission: RE | Admit: 2017-01-13 | Discharge: 2017-01-13 | Disposition: A | Discharge status: Home / Self Care | Payer: Self-pay | Source: Ambulatory Visit | Attending: Adult Health | Admitting: Adult Health

## 2017-01-13 VITALS — BP 130/60 | HR 70 | Ht 63.5 in | Wt 216.5 lb

## 2017-01-13 DIAGNOSIS — R87610 Atypical squamous cells of undetermined significance on cytologic smear of cervix (ASC-US): Secondary | ICD-10-CM | POA: Insufficient documentation

## 2017-01-13 DIAGNOSIS — Z308 Encounter for other contraceptive management: Secondary | ICD-10-CM

## 2017-01-13 DIAGNOSIS — N8189 Other female genital prolapse: Secondary | ICD-10-CM

## 2017-01-13 DIAGNOSIS — N3946 Mixed incontinence: Secondary | ICD-10-CM | POA: Insufficient documentation

## 2017-01-13 DIAGNOSIS — N816 Rectocele: Secondary | ICD-10-CM

## 2017-01-13 DIAGNOSIS — Z3009 Encounter for other general counseling and advice on contraception: Secondary | ICD-10-CM | POA: Insufficient documentation

## 2017-01-13 DIAGNOSIS — Z01419 Encounter for gynecological examination (general) (routine) without abnormal findings: Secondary | ICD-10-CM

## 2017-01-13 DIAGNOSIS — J0140 Acute pansinusitis, unspecified: Secondary | ICD-10-CM

## 2017-01-13 MED ORDER — AMOXICILLIN-POT CLAVULANATE 875-125 MG PO TABS: 1.0000 | ORAL_TABLET | Freq: Two times a day (BID) | ORAL | 0 refills | Status: DC

## 2017-01-13 MED ORDER — FLUCONAZOLE 150 MG PO TABS: 150.0000 mg | ORAL_TABLET | Freq: Once | ORAL | 2 refills | Status: AC

## 2017-01-13 MED ORDER — PREDNISONE 10 MG PO TABS: ORAL_TABLET | ORAL | 0 refills | Status: DC

## 2017-01-13 NOTE — Progress Notes (Signed)
Patient ID: Deborah Barr, female   DOB: 03/09/1981, 36 y.o.   MRN: [id number] History of Present Illness: Deborah Barr is a 36 year old white female, married in for well woman gyn exam and pap.She is sp tubal and ablation, but still has short period.She complains of ? Lump left breast and possible prolapse and sinus troubles. She has mixed UI at times and stools more loose than firm. She does lift heavy objects, like when cutting wood.  She is on Nutrisystem for last 3 weeks and has lost 7 lbs.    Current Medications, Allergies, Past Medical History, Past Surgical History, Family History and Social History were reviewed in [employer] record.     Review of Systems: Patient denies any headaches, hearing loss, fatigue, blurred vision, shortness of breath, chest pain, abdominal pain, problems with bowel movements,  or intercourse. No joint pain or mood swings. See HPI for positives.    Physical Exam:BP 130/60 (BP Location: Left Arm, Patient Position: Sitting, Cuff Size: Large)   Pulse 70   Ht 5' 3.5" (1.613 m)   Wt 216 lb 8 oz (98.2 kg)   LMP 01/03/2017 (Approximate)   BMI 37.75 kg/m  General:  Well developed, well nourished, no acute distress Skin:  Warm and dry Ears are clear, but has tenderness in frontal and maxillary and ethmoid sinuses(has had surgery in past) Neck:  Midline trachea, normal thyroid, good ROM, no lymphadenopathy Lungs; Clear to auscultation bilaterally Breast:  No dominant palpable mass, retraction, or nipple discharge, has red 1 mm area at edge of areola at 7 o'clock left breast with some thickness in skin, not breast tissue, and she says it peeled Cardiovascular: Regular rate and rhythm Abdomen:  Soft, non tender, no hepatosplenomegaly Pelvic:  External genitalia is normal in appearance, no lesions.  The vagina is normal in appearance. Urethra has no lesions or masses. The cervix is bulbous.Pap with HPV and GC/CHL performed. Has some uterine  descensus and mild cystocele. Uterus is felt to be normal size, shape, and contour.  No adnexal masses or tenderness noted.Bladder is non tender, no masses felt. Rectal: Good sphincter tone, no polyps, or hemorrhoids felt.  Hemoccult negative.+rectocele  Extremities/musculoskeletal:  No swelling or varicosities noted, no clubbing or cyanosis Psych:  No mood changes, alert and cooperative,seems happy PHQ 2 score 1.Reviewed Medical Explainer #4 with her.   Impression:  1. Encounter for routine gynecological examination with Papanicolaou smear of cervix   2. Family planning   3. Subacute pansinusitis   4. Pelvic relaxation   5. Rectocele   6. Mixed stress and urge urinary incontinence      Plan: Meds ordered this encounter  Medications  . ibuprofen (ADVIL,MOTRIN) 200 MG tablet    Sig: Take 600 mg by mouth as needed.  Marland Kitchen amoxicillin-clavulanate (AUGMENTIN) 875-125 MG tablet    Sig: Take 1 tablet by mouth 2 (two) times daily.    Dispense:  20 tablet    Refill:  0    Order Specific Question:   Supervising Provider    Answer:   Elonda Husky, LUTHER H [[id number]]  . predniSONE (DELTASONE) 10 MG tablet    Sig: Take 4 po daily for 10 days    Dispense:  40 tablet    Refill:  0    Order Specific Question:   Supervising Provider    Answer:   Elonda Husky, LUTHER H [[id number]]  . fluconazole (DIFLUCAN) 150 MG tablet    Sig: Take 1 tablet (150 mg total)  by mouth once.    Dispense:  1 tablet    Refill:  2    Order Specific Question:   Supervising Provider    Answer:   Florian Buff [[id number]]  Return in 2 weeks to see Dr Elonda Husky about possible pessary  Physical in 1 year Pap in 3 if normal Mammogram at 38 Check HIV and RPR

## 2017-01-14 LAB — RPR: RPR: NONREACTIVE

## 2017-01-14 LAB — HIV ANTIBODY (ROUTINE TESTING W REFLEX): HIV SCREEN 4TH GENERATION: NONREACTIVE

## 2017-01-18 ENCOUNTER — Telehealth: Payer: Self-pay | Admitting: Adult Health

## 2017-01-18 ENCOUNTER — Encounter: Payer: Self-pay | Admitting: Adult Health

## 2017-01-18 DIAGNOSIS — R87610 Atypical squamous cells of undetermined significance on cytologic smear of cervix (ASC-US): Secondary | ICD-10-CM

## 2017-01-18 HISTORY — DX: Atypical squamous cells of undetermined significance on cytologic smear of cervix (ASC-US): R87.610

## 2017-01-18 LAB — CYTOLOGY - PAP
ADEQUACY: ABSENT — AB
Chlamydia: NEGATIVE
DIAGNOSIS: UNDETERMINED — AB
HPV: NOT DETECTED
Neisseria Gonorrhea: NEGATIVE

## 2017-01-18 NOTE — Telephone Encounter (Signed)
Left message to repeat pap in 1 year, was ASCUS with negative HPV

## 2017-01-27 ENCOUNTER — Ambulatory Visit: Payer: Medicaid Other | Admitting: Obstetrics & Gynecology

## 2017-02-08 ENCOUNTER — Other Ambulatory Visit: Payer: Self-pay | Admitting: *Deleted

## 2017-02-08 MED ORDER — ESCITALOPRAM OXALATE 20 MG PO TABS: 20.0000 mg | ORAL_TABLET | Freq: Every day | ORAL | 3 refills | Status: DC

## 2017-02-23 ENCOUNTER — Other Ambulatory Visit: Payer: Self-pay | Admitting: *Deleted

## 2017-02-23 NOTE — Telephone Encounter (Signed)
Patient wants prescription for Lexapro sent to [facility] Verbal order called.

## 2017-04-01 ENCOUNTER — Telehealth: Payer: Self-pay | Admitting: *Deleted

## 2017-04-01 NOTE — Telephone Encounter (Signed)
Pt called requesting to know her blood type. I informed pt that per labs done in 2011, which is the only time I could see a blood type done, she is O-. Pt was appreciative of the information.

## 2018-03-17 ENCOUNTER — Other Ambulatory Visit: Payer: Self-pay | Admitting: Obstetrics & Gynecology

## 2018-05-23 ENCOUNTER — Encounter: Payer: Self-pay | Admitting: Obstetrics & Gynecology

## 2018-05-23 ENCOUNTER — Other Ambulatory Visit: Payer: Self-pay

## 2018-05-23 ENCOUNTER — Ambulatory Visit: Payer: BC Managed Care – PPO | Admitting: Obstetrics & Gynecology

## 2018-05-23 VITALS — BP 121/70 | HR 82 | Ht 61.0 in | Wt 214.0 lb

## 2018-05-23 DIAGNOSIS — F329 Major depressive disorder, single episode, unspecified: Secondary | ICD-10-CM | POA: Diagnosis not present

## 2018-05-24 ENCOUNTER — Encounter: Payer: Self-pay | Admitting: Obstetrics & Gynecology

## 2018-05-24 NOTE — Progress Notes (Signed)
Subjective:   Deborah Barr is an 37 y.o. female who presents for evaluation and treatment of depressive symptoms.  Onset approximately 4 months ago, gradually worsening since that time.  Current symptoms include depressed mood, anhedonia, insomnia, fatigue, difficulty concentrating, impaired memory, recurrent thoughts of death, suicidal thoughts without plan, anxiety, loss of energy/fatigue,.  Current treatment for depression:lexapro 20 mg for a couple of years Sleep problems: Moderate   Early awakening:Absent   Energy: Fair Motivation: Poor Concentration: Fair Rumination/worrying: Marked Memory: Fair Tearfulness: Marked  Anxiety: Moderate  Panic: Absent  Overall Mood: Moderately worse  Hopelessness: Mild Suicidal ideation: Moderate  Other/Psychosocial Stressors: husband's extramarital affair Family history positive for depression in the patient's unknown.  Previous treatment modalities employed include Medication.  Past episodes of depression:yes Organic causes of depression present: None.  Review of Systems Pertinent items are noted in HPI.   Objective:   Mental Status Examination: Posture and motor behavior: Appropriate Dress, grooming, personal hygiene: Appropriate Facial expression: Appropriate Speech: Positive for pressured Mood: Positive for depressed Coherency and relevance of thought: normal Thought content: Appropriate Perceptions: Appropriate Orientation:Appropriate Attention and concentration: Appropriate Memory: : Appropriate Information: Not examined Vocabulary: Appropriate Abstract reasoning: Not examined Judgment: Appropriate    Assessment:   Experiencing the following symptoms of depression most of the day nearly every day for more than two consecutive weeks: depressed mood, loss of interests/pleasure, change in sleep, loss of energy, trouble concentrating, thoughts about death or suicide  Depressive Disorder:Baseline depression with  exaserbation  Suicide Risk Assessment:  Suicidal intent: none Suicidal plan: none Access to means for suicide: n/a Lethality of means for suicide: none Prior suicide attempts: none Recent exposure to suicide:negative    Plan:   Reactive depression (situational)  Reviewed concept of depression as biochemical imbalance of neurotransmitters and rationale for treatment. Instructed patient to contact office or on-call physician promptly should condition worsen or any new symptoms appear and provided on-call telephone numbers.    Referred Analise to [facility] in [place] for medication adjustment and counseling, direct contact made     Face to face time:  25 minutes  Greater than 50% of the visit time was spent in counseling and coordination of care with the patient.  The summary and outline of the counseling and care coordination is summarized in the note above.   All questions were answered.

## 2018-07-03 ENCOUNTER — Other Ambulatory Visit: Payer: Self-pay | Admitting: Adult Health

## 2018-10-27 ENCOUNTER — Other Ambulatory Visit: Payer: Self-pay

## 2018-10-27 ENCOUNTER — Ambulatory Visit (INDEPENDENT_AMBULATORY_CARE_PROVIDER_SITE_OTHER): Payer: BC Managed Care – PPO | Admitting: Obstetrics & Gynecology

## 2018-10-27 ENCOUNTER — Encounter: Payer: Self-pay | Admitting: Obstetrics & Gynecology

## 2018-10-27 ENCOUNTER — Other Ambulatory Visit (HOSPITAL_COMMUNITY)
Admission: RE | Admit: 2018-10-27 | Discharge: 2018-10-27 | Disposition: A | Discharge status: Home / Self Care | Payer: BC Managed Care – PPO | Source: Ambulatory Visit | Attending: Obstetrics & Gynecology | Admitting: Obstetrics & Gynecology

## 2018-10-27 VITALS — BP 115/74 | HR 74 | Ht 61.0 in | Wt 216.0 lb

## 2018-10-27 DIAGNOSIS — Z01419 Encounter for gynecological examination (general) (routine) without abnormal findings: Secondary | ICD-10-CM

## 2018-10-27 DIAGNOSIS — N8110 Cystocele, unspecified: Secondary | ICD-10-CM

## 2018-10-27 DIAGNOSIS — Z1151 Encounter for screening for human papillomavirus (HPV): Secondary | ICD-10-CM | POA: Insufficient documentation

## 2018-10-27 DIAGNOSIS — N814 Uterovaginal prolapse, unspecified: Secondary | ICD-10-CM

## 2018-10-27 DIAGNOSIS — R87810 Cervical high risk human papillomavirus (HPV) DNA test positive: Secondary | ICD-10-CM | POA: Diagnosis not present

## 2018-10-27 MED ORDER — SILVER SULFADIAZINE 1 % EX CREA: 1.0000 "application " | TOPICAL_CREAM | Freq: Three times a day (TID) | CUTANEOUS | 11 refills | Status: AC

## 2018-10-27 MED ORDER — ESCITALOPRAM OXALATE 20 MG PO TABS: 20.0000 mg | ORAL_TABLET | Freq: Every day | ORAL | 3 refills | Status: DC

## 2018-10-27 NOTE — Progress Notes (Signed)
Subjective:     Deborah Barr is a 38 y.o. female here for a routine exam.  Patient's last menstrual period was 10/08/2018. [id number] Birth Control Method:  Tubal ligation + ablation Menstrual Calendar(currently): minimal  Current complaints: none.   Current acute medical issues:  none   Recent Gynecologic History Patient's last menstrual period was 10/08/2018. Last Pap: 2019,  normal Last mammogram: n/a,    Past Medical History:  Diagnosis Date  . Abnormal Pap smear   . Anxiety   . Atypical squamous cell changes of undetermined significance (ASCUS) on cervical cytology with negative high risk human papilloma virus (HPV) test result 01/18/2017  . Boil of buttock 10/30/2013  . Depression   . Dysmenorrhea 10/30/2013  . 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 Gallstone   . History of cholecystectomy 2012   at 16 weeks  . HPV (human papilloma virus) anogenital infection   . HPV (human papilloma virus) infection   . Menorrhagia 10/30/2013  . Recurrent sinus infections   . Seasonal allergies   . 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 Vaginal Pap smear, abnormal     Past Surgical History:  Procedure Laterality Date  . CHOLECYSTECTOMY  2012  . DILITATION & CURRETTAGE/HYSTROSCOPY WITH THERMACHOICE ABLATION N/A 12/19/2013   Procedure: DILATATION & CURETTAGE/HYSTEROSCOPY WITH THERMACHOICE ABLATION;  Surgeon: Florian Buff, MD;  Location: [facility];  Service: Gynecology;  Laterality: N/A;   20  ml in    20  ml out temp 88 degrees celcius total therapy time 46min 15 seconds  . EYE MUSCLE SURGERY     age 37  . EYE MUSCLE SURGERY     as child  . LAPAROSCOPIC BILATERAL SALPINGECTOMY Bilateral 12/19/2013   Procedure: LAPAROSCOPIC BILATERAL SALPINGECTOMY;  Surgeon: Florian Buff, MD;  Location: [facility];  Service: Gynecology;  Laterality: Bilateral;  . LAPAROSCOPIC CHOLECYSTECTOMY  09/04/10   Dr. Ander Gaster performed at [redacted] weeks  gestation  . LEEP  2001   [facility]  . LEEP    . NASAL SEPTUM SURGERY  2011  . TYMPANOPLASTY     childhood    OB History    Gravida  3   Para  2   Term  1   Preterm  0   AB  1   Living  2     SAB  1   TAB  0   Ectopic  0   Multiple  0   Live Births  2           Social History   Socioeconomic History  . Marital status: Married    Spouse name: Not on file  . Number of children: 1  . Years of education: 48  . Highest education level: Not on file  Occupational History  . Occupation: homemaker  Social Needs  . Financial resource strain: Not on file  . Food insecurity:    Worry: Not on file    Inability: Not on file  . Transportation needs:    Medical: Not on file    Non-medical: Not on file  Tobacco Use  . Smoking status: Never Smoker  . Smokeless tobacco: Never Used  . Tobacco comment: never used snuff or chewing tobacco.  Substance and Sexual Activity  . Alcohol use: No  . Drug use: No  . Sexual activity: Yes    Birth control/protection: Surgical  Comment: tubal and ablation  Lifestyle  . Physical activity:    Days per week: Not on file    Minutes per session: Not on file  . Stress: Not on file  Relationships  . Social connections:    Talks on phone: Not on file    Gets together: Not on file    Attends religious service: Not on file    Active member of club or organization: Not on file    Attends meetings of clubs or organizations: Not on file    Relationship status: Not on file  Other Topics Concern  . Not on file  Social History Narrative  . Not on file    Family History  Problem Relation Age of Onset  . Heart disease Father   . Hypertension Father      Current Outpatient Medications:  .  buPROPion (WELLBUTRIN XL) 150 MG 24 hr tablet, Take 1 tablet by mouth daily., Disp: , Rfl:  .  escitalopram (LEXAPRO) 20 MG tablet, Take 1 tablet (20 mg total) by mouth daily., Disp: 90 tablet, Rfl: 3 .  ibuprofen  (ADVIL,MOTRIN) 200 MG tablet, Take 600 mg by mouth as needed., Disp: , Rfl:  .  naproxen (NAPROSYN) 500 MG tablet, Take 500 mg by mouth 2 (two) times daily with a meal., Disp: , Rfl: 1  Review of Systems  Review of Systems  Constitutional: Negative for fever, chills, weight loss, malaise/fatigue and diaphoresis.  HENT: Negative for hearing loss, ear pain, nosebleeds, congestion, sore throat, neck pain, tinnitus and ear discharge.   Eyes: Negative for blurred vision, double vision, photophobia, pain, discharge and redness.  Respiratory: Negative for cough, hemoptysis, sputum production, shortness of breath, wheezing and stridor.   Cardiovascular: Negative for chest pain, palpitations, orthopnea, claudication, leg swelling and PND.  Gastrointestinal: negative for abdominal pain. Negative for heartburn, nausea, vomiting, diarrhea, constipation, blood in stool and melena.  Genitourinary: Negative for dysuria, urgency, frequency, hematuria and flank pain.  Musculoskeletal: Negative for myalgias, back pain, joint pain and falls.  Skin: Negative for itching and rash.  Neurological: Negative for dizziness, tingling, tremors, sensory change, speech change, focal weakness, seizures, loss of consciousness, weakness and headaches.  Endo/Heme/Allergies: Negative for environmental allergies and polydipsia. Does not bruise/bleed easily.  Psychiatric/Behavioral: Negative for depression, suicidal ideas, hallucinations, memory loss and substance abuse. The patient is not nervous/anxious and does not have insomnia.        Objective:  Blood pressure 115/74, pulse 74, height 5\' 1"  (1.549 m), weight 216 lb (98 kg), last menstrual period 10/08/2018.   Physical Exam  Vitals reviewed. Constitutional: She is oriented to person, place, and time. She appears well-developed and well-nourished.  HENT:  Head: Normocephalic and atraumatic.        Right Ear: External ear normal.  Left Ear: External ear normal.  Nose:  Nose normal.  Mouth/Throat: Oropharynx is clear and moist.  Eyes: Conjunctivae and EOM are normal. Pupils are equal, round, and reactive to light. Right eye exhibits no discharge. Left eye exhibits no discharge. No scleral icterus.  Neck: Normal range of motion. Neck supple. No tracheal deviation present. No thyromegaly present.  Cardiovascular: Normal rate, regular rhythm, normal heart sounds and intact distal pulses.  Exam reveals no gallop and no friction rub.   No murmur heard. Respiratory: Effort normal and breath sounds normal. No respiratory distress. She has no wheezes. She has no rales. She exhibits no tenderness.  GI: Soft. Bowel sounds are normal. She exhibits no distension and  no mass. There is no tenderness. There is no rebound and no guarding.  Genitourinary:  Breasts no masses skin changes or nipple changes bilaterally      Vulva is normal without lesions Vagina is pink moist without discharge  Grade II cystocoele and uterine prolapse Cervix normal in appearance and pap is done Uterus is normal size shape and contour Adnexa is negative with normal sized ovaries   Musculoskeletal: Normal range of motion. She exhibits no edema and no tenderness.  Neurological: She is alert and oriented to person, place, and time. She has normal reflexes. She displays normal reflexes. No cranial nerve deficit. She exhibits normal muscle tone. Coordination normal.  Skin: Skin is warm and dry. No rash noted. No erythema. No pallor.  Psychiatric: She has a normal mood and affect. Her behavior is normal. Judgment and thought content normal.       Medications Ordered at today's visit: Meds ordered this encounter  Medications  . escitalopram (LEXAPRO) 20 MG tablet    Sig: Take 1 tablet (20 mg total) by mouth daily.    Dispense:  90 tablet    Refill:  3    Other orders placed at today's visit: No orders of the defined types were placed in this encounter.     Assessment:    Healthy female  exam.    Plan:    Contraception: tubal ligation. Mammogram ordered. Follow up in: 1 year.     Return in about 1 year (around 10/27/2019) for yearly, with Dr Elonda Husky.

## 2018-10-27 NOTE — Patient Instructions (Signed)
Pelvic Organ Prolapse Pelvic organ prolapse is the stretching, bulging, or dropping of pelvic organs into an abnormal position. It happens when the muscles and tissues that surround and support pelvic structures become weak or stretched. Pelvic organ prolapse can involve the:  Vagina (vaginal prolapse).  Uterus (uterine prolapse).  Bladder (cystocele).  Rectum (rectocele).  Intestines (enterocele). When organs other than the vagina are involved, they often bulge into the vagina or protrude from the vagina, depending on how severe the prolapse is. What are the causes? This condition may be caused by:  Pregnancy, labor, and childbirth.  Past pelvic surgery.  Decreased production of the hormone estrogen associated with menopause.  Consistently lifting more than 50 lb (23 kg).  Obesity.  Long-term inability to pass stool (chronic constipation).  A cough that lasts a long time (chronic).  Buildup of fluid in the abdomen due to certain diseases and other conditions. What are the signs or symptoms? Symptoms of this condition include:  Passing a little urine (loss of bladder control) when you cough, sneeze, strain, and exercise (stress incontinence). This may be worse immediately after childbirth. It may gradually improve over time.  Feeling pressure in your pelvis or vagina. This pressure may increase when you cough or when you are passing stool.  A bulge that protrudes from the opening of your vagina.  Difficulty passing urine or stool.  Pain in your lower back.  Pain, discomfort, or disinterest in sex.  Repeated bladder infections (urinary tract infections).  Difficulty inserting a tampon. In some people, this condition causes no symptoms. How is this diagnosed? This condition may be diagnosed based on a vaginal and rectal exam. During the exam, you may be asked to cough and strain while you are lying down, sitting, and standing up. Your health care provider will  determine if other tests are required, such as bladder function tests. How is this treated? Treatment for this condition may depend on your symptoms. Treatment may include:  Lifestyle changes, such as changes to your diet.  Emptying your bladder at scheduled times (bladder training therapy). This can help reduce or avoid urinary incontinence.  Estrogen. Estrogen may help mild prolapse by increasing the strength and tone of pelvic floor muscles.  Kegel exercises. These may help mild cases of prolapse by strengthening and tightening the muscles of the pelvic floor.  A soft, flexible device that helps support the vaginal walls and keep pelvic organs in place (pessary). This is inserted into your vagina by your health care provider.  Surgery. This is often the only form of treatment for severe prolapse. Follow these instructions at home:  Avoid drinking beverages that contain caffeine or alcohol.  Increase your intake of high-fiber foods. This can help decrease constipation and straining during bowel movements.  Lose weight if recommended by your health care provider.  Wear a sanitary pad or adult diapers if you have urinary incontinence.  Avoid heavy lifting and straining with exercise and work. Do not hold your breath when you perform mild to moderate lifting and exercise activities. Limit your activities as directed by your health care provider.  Do Kegel exercises as directed by your health care provider. To do this: ? Squeeze your pelvic floor muscles tight. You should feel a tight lift in your rectal area and a tightness in your vaginal area. Keep your stomach, buttocks, and legs relaxed. ? Hold the muscles tight for up to 10 seconds. ? Relax your muscles. ? Repeat this exercise 50 times a day,   or as many times as told by your health care provider. Continue to do this exercise for at least 4-6 weeks, or for as long as told by your health care provider.  Take over-the-counter and  prescription medicines only as told by your health care provider.  If you have a pessary, take care of it as told by your health care provider.  Keep all follow-up visits as told by your health care provider. This is important. Contact a health care provider if you:  Have symptoms that interfere with your daily activities or sex life.  Need medicine to help with the discomfort.  Notice bleeding from your vagina that is not related to your period.  Have a fever.  Have pain or bleeding when you urinate.  Have bleeding when you pass stool.  Pass urine when you have sex.  Have chronic constipation.  Have a pessary that falls out.  Have bad smelling vaginal discharge.  Have an unusual, low pain in your abdomen. Summary  Pelvic organ prolapse is the stretching, bulging, or dropping of pelvic organs into an abnormal position. It happens when the muscles and tissues that surround and support pelvic structures become weak or stretched.  When organs other than the vagina are involved, they often bulge into the vagina or protrude from the vagina, depending on how severe the prolapse is.  In most cases, this condition needs to be treated only if it produces symptoms. Treatment may include lifestyle changes, estrogen, Kegel exercises, pessary insertion, or surgery.  Avoid heavy lifting and straining with exercise and work. Do not hold your breath when you perform mild to moderate lifting and exercise activities. Limit your activities as directed by your health care provider. This information is not intended to replace advice given to you by your health care provider. Make sure you discuss any questions you have with your health care provider. Document Released: 02/20/2014 Document Revised: 08/17/2017 Document Reviewed: 08/17/2017 Elsevier Interactive Patient Education  2019 [employer]  

## 2018-10-30 LAB — CYTOLOGY - PAP
Chlamydia: NEGATIVE
Diagnosis: NEGATIVE
HPV: DETECTED — AB
Neisseria Gonorrhea: NEGATIVE

## 2019-01-16 ENCOUNTER — Telehealth: Payer: Self-pay | Admitting: Obstetrics & Gynecology

## 2019-01-16 NOTE — Telephone Encounter (Signed)
Pt requesting a call to discuss her lab results from 10/27/2018.

## 2019-01-16 NOTE — Telephone Encounter (Signed)
Attempted to call pt but mailbox was full. Pap + for HPV; needs to be repeated in 1 year. [place]

## 2019-07-25 NOTE — Patient Instructions (Signed)
Get covid test at [place] 07/26/19 at 12:15pm   Deborah Barr      Your procedure is scheduled on 07/30/19   Report to [facility]  at 5:30  A.M.   Call this number if you have problems the morning of surgery:[phone]   OUR ADDRESS IS [place], WE ARE LOCATED [facility] THE [facility] WITH [facility].   Remember:  Do not eat food or drink liquids after midnight.   Take these medicines the morning of surgery with A SIP OF WATER Wellbutrin,Lexapro, Zyrtec   Do not wear jewelry, make-up or nail polish.  Do not wear lotions, powders, or perfumes, or deoderant.  Do not shave 48 hours prior to surgery.  Men may shave face and neck.  Do not bring valuables to the hospital.  [facility] is not responsible for any belongings or valuables.  Contacts, dentures or bridgework may not be worn into surgery.   For patients admitted to the hospital, discharge time will be determined by your treatment team.  Patients discharged the day of surgery will not be allowed to drive home.   Special instructions:   Please read over the following fact sheets that you were given:       [facility] - Preparing for Surgery  Before surgery, you can play an important role.   Because skin is not sterile, your skin needs to be as free of germs as possible .  You can reduce the number of germs on your skin by washing with CHG (chlorahexidine gluconate) soap before surgery.   CHG is an antiseptic cleaner which kills germs and bonds with the skin to continue killing germs even after washing. Please DO NOT use if you have an allergy to CHG or antibacterial soaps.   If your skin becomes reddened/irritated stop using the CHG and inform your nurse when you arrive at Short Stay. Do not shave (including legs and underarms) for at least 48 hours prior to the first CHG shower.    Please follow these instructions carefully:  1.  Shower with CHG Soap the night before  surgery and the  morning of Surgery.  2.  If you choose to wash your hair, wash your hair first as usual with your  normal  shampoo.  3.  After you shampoo, rinse your hair and body thoroughly to remove the  shampoo.                                        4.  Use CHG as you would any other liquid soap.  You can apply chg directly  to the skin and wash                       Gently with a scrungie or clean washcloth.  5.  Apply the CHG Soap to your body ONLY FROM THE NECK DOWN.   Do not use on face/ open                           Wound or open sores. Avoid contact with eyes, ears mouth and genitals (private parts).                       Wash face,  Genitals (private parts) with your normal  soap.             6.  Wash thoroughly, paying special attention to the area where your surgery  will be performed.  7.  Thoroughly rinse your body with warm water from the neck down.  8.  DO NOT shower/wash with your normal soap after using and rinsing off  the CHG Soap.             9.  Pat yourself dry with a clean towel.            10.  Wear clean pajamas.            11.  Place clean sheets on your bed the night of your first shower and do not  sleep with pets. Day of Surgery : Do not apply any lotions/deodorants the morning of surgery.  Please wear clean clothes to the hospital/surgery center.  FAILURE TO FOLLOW THESE INSTRUCTIONS MAY RESULT IN THE CANCELLATION OF YOUR SURGERY PATIENT SIGNATURE_________________________________  NURSE SIGNATURE__________________________________  ________________________________________________________________________

## 2019-07-25 NOTE — H&P (Addendum)
HPI Deborah Barr presents today surgical treatment of pelvic prolapse symptoms and aub, menorrhagia.  . She is followed by Dr. Elonda Husky for years. She had menorrhagia, had an endometrial ablation, which worked well for a while, but most recently began having heavy menstrual cycles and bleeding after intercourse. She also complains of pelvic pressure and feels the cervix and uterus at the introitus a lot of time with standing or lifting, also complains of leaking of urine and feels pressure from the bladder dropping and also has rectal symptoms with constipation now requiring digitalization due to rectocele. She has seen Dr. Elonda Husky and his physician assistant, has talked about surgical options and he recommended she wait as long as she can until she cannot stand it anymore preferably another 10 to 15 years. Deborah Barr feels at this point that she has interrupted the quality of her life due to the fact that she cannot do a lot of things due to these symptoms and wants surgical treatment. Her mother had similar situations when she was 5. She has a history of childbirth with 2 large children, leaks with coughing, laughing, sneezing requires digitalization due to rectocele and pelvic prolapse symptoms. ROS None recorded. Physical Exam Patient is a 38 year old female.  Constitutional: General Appearance: healthy-appearing, well-nourished, and well-developed.  Psychiatric: Orientation: to time, place, and person. Mood and Affect: normal mood and affect and active and alert.  Abdomen: Auscultation/Inspection/Palpation: no tenderness, hepatomegaly, splenomegaly, masses, or CVA tenderness and soft, non-distended, and normal bowel sounds. Hernia: none palpated.  Female Genitalia: Vulva: no masses, atrophy, or lesions. Bladder/Urethra: no urethral discharge or mass and normal meatus and bladder non distended. Vagina no tenderness, erythema, abnormal vaginal discharge, or vesicle(s) or ulcers; 3rd degree cystocoele and  recotocole noted. loss of UV angle with valsalva. Cervix: no discharge or cervical motion tenderness and grossly normal; prolapse to introitus with valsava. Uterus: normal size and shape and midline, mobile, non-tender, and uterine prolapse; 3rd degree prolapse. Adnexa/Parametria: no parametrial tenderness or mass and no adnexal tenderness or ovarian mass. Assessment / Plan 1. Rectocele co-occurrent with incomplete uterovaginal prolapse with digitalizatoin N81.2: Incomplete uterovaginal prolapse  2. Cystocele and SUI N81.10: Cystocele  3.  AUB and menorrhagia s/p ablation now with recurrence  4. Overweight - We discussed the importance of regular cardiovascular exercise, ideally 150 minutes weekly. We discussed caloric restriction with regular small meals, incorporating vegetables and fruits, fiber, and proteins, and limiting sugars, and high carbohydrate foods. Adipex will help with her metabolism as well as appetite suppression. may consider this in the future E66.3: Overweight  5. Uterine prolapse - discussed surgical options LAVH/Bil Sal/A&P repair/Kelly plication and SSLS [id number]: Uterovaginal prolapse, unspecified  Discussion Notes Had a long discussion with Raziya regards to her pelvic exam. She does have uterine prolapse with 3rd degree cystocele and 3rd degree rectocele with loss of the UV angle, the menorrhagia also would further push towards surgical intervention. We recommend  LAVH with bilateral salpingectomy, recommend preservation of the ovaries since she is 38, recommend anterior and posterior colporrhaphy, sacrospinous ligament fixation, Kelly plication. Discussed possibility of urogynecological evaluation for possible sling procedure. Discussed the procedure, it's recovery success and complication rates, discussed that there would be about 65% cure rate with Kelly plication, if she wanted 90% would recommend urological evaluation for possible sling procedure. Discussed recurrence  risk with the prolapse. All of her questions answered and she gives informed consent.  This patient has been seen and examined.   All of her questions were answered.  Labs and vital signs reviewed.  Informed consent has been obtained.  The History and Physical is current. 07/30/19 0700 DL

## 2019-07-26 ENCOUNTER — Encounter (HOSPITAL_COMMUNITY)
Admission: RE | Admit: 2019-07-26 | Discharge: 2019-07-26 | Disposition: A | Discharge status: Home / Self Care | Payer: BC Managed Care – PPO | Source: Ambulatory Visit | Attending: Obstetrics and Gynecology | Admitting: Obstetrics and Gynecology

## 2019-07-26 ENCOUNTER — Other Ambulatory Visit (HOSPITAL_COMMUNITY)
Admission: RE | Admit: 2019-07-26 | Discharge: 2019-07-26 | Disposition: A | Discharge status: Home / Self Care | Payer: BC Managed Care – PPO | Source: Ambulatory Visit | Attending: Obstetrics and Gynecology | Admitting: Obstetrics and Gynecology

## 2019-07-26 ENCOUNTER — Encounter (HOSPITAL_COMMUNITY): Payer: Self-pay

## 2019-07-26 ENCOUNTER — Other Ambulatory Visit: Payer: Self-pay

## 2019-07-26 DIAGNOSIS — N814 Uterovaginal prolapse, unspecified: Secondary | ICD-10-CM | POA: Insufficient documentation

## 2019-07-26 DIAGNOSIS — Z20828 Contact with and (suspected) exposure to other viral communicable diseases: Secondary | ICD-10-CM | POA: Insufficient documentation

## 2019-07-26 DIAGNOSIS — N393 Stress incontinence (female) (male): Secondary | ICD-10-CM | POA: Diagnosis not present

## 2019-07-26 DIAGNOSIS — N939 Abnormal uterine and vaginal bleeding, unspecified: Secondary | ICD-10-CM | POA: Diagnosis not present

## 2019-07-26 DIAGNOSIS — Z01812 Encounter for preprocedural laboratory examination: Secondary | ICD-10-CM | POA: Insufficient documentation

## 2019-07-26 HISTORY — DX: Nausea with vomiting, unspecified: Z98.890

## 2019-07-26 HISTORY — DX: Other specified postprocedural states: R11.2

## 2019-07-26 HISTORY — DX: Gastro-esophageal reflux disease without esophagitis: K21.9

## 2019-07-26 LAB — CBC
HCT: 39.9 % (ref 36.0–46.0)
Hemoglobin: 12.9 g/dL (ref 12.0–15.0)
MCH: 29.5 pg (ref 26.0–34.0)
MCHC: 32.3 g/dL (ref 30.0–36.0)
MCV: 91.1 fL (ref 80.0–100.0)
Platelets: 249 10*3/uL (ref 150–400)
RBC: 4.38 MIL/uL (ref 3.87–5.11)
RDW: 12.6 % (ref 11.5–15.5)
WBC: 9.1 10*3/uL (ref 4.0–10.5)
nRBC: 0 % (ref 0.0–0.2)

## 2019-07-26 LAB — ABO/RH: ABO/RH(D): O NEG

## 2019-07-26 NOTE — Progress Notes (Signed)
PCP - Dr. Ezekiel Ina Cardiologist -none   Chest x-ray -  no EKG - no Stress Test - no ECHO - no Cardiac Cath -no   Sleep Study - no CPAP -   Fasting Blood Sugar - NA Checks Blood Sugar _____ times a day  Blood Thinner Instructions: NA Aspirin Instructions: Last Dose:  Anesthesia review:   Patient denies shortness of breath, fever, cough and chest pain at PAT appointment yes  Patient verbalized understanding of instructions that were given to them at the PAT appointment. Patient was also instructed that they will need to review over the PAT instructions again at home before surgery. yes

## 2019-07-27 LAB — NOVEL CORONAVIRUS, NAA (HOSP ORDER, SEND-OUT TO REF LAB; TAT 18-24 HRS): SARS-CoV-2, NAA: NOT DETECTED

## 2019-07-29 NOTE — Anesthesia Preprocedure Evaluation (Addendum)
Anesthesia Evaluation  Patient identified by MRN, date of birth, ID band Patient awake    Reviewed: Allergy & Precautions, NPO status , Patient's Chart, lab work & pertinent test results  History of Anesthesia Complications (+) PONV  Airway Mallampati: I  TM Distance: >3 FB Neck ROM: Full    Dental no notable dental hx. (+) Teeth Intact, Dental Advisory Given   Pulmonary neg pulmonary ROS,    Pulmonary exam normal breath sounds clear to auscultation       Cardiovascular Exercise Tolerance: Good Normal cardiovascular exam Rhythm:Regular Rate:Normal     Neuro/Psych negative neurological ROS  negative psych ROS   GI/Hepatic Neg liver ROS, GERD  ,  Endo/Other  negative endocrine ROS  Renal/GU      Musculoskeletal negative musculoskeletal ROS (+)   Abdominal (+) + obese,   Peds  Hematology   Anesthesia Other Findings   Reproductive/Obstetrics negative OB ROS                         Anesthesia Physical Anesthesia Plan  ASA: III  Anesthesia Plan: General   Post-op Pain Management:    Induction: Intravenous  PONV Risk Score and Plan: 4 or greater and Treatment may vary due to age or medical condition, Midazolam, Scopolamine patch - Pre-op, Ondansetron and Dexamethasone  Airway Management Planned: Oral ETT  Additional Equipment:   Intra-op Plan:   Post-operative Plan: Extubation in OR  Informed Consent: I have reviewed the patients History and Physical, chart, labs and discussed the procedure including the risks, benefits and alternatives for the proposed anesthesia with the patient or authorized representative who has indicated his/her understanding and acceptance.     Dental advisory given  Plan Discussed with:   Anesthesia Plan Comments: (GA w lidocaine intfusiobn)       Anesthesia Quick Evaluation

## 2019-07-30 ENCOUNTER — Encounter (HOSPITAL_BASED_OUTPATIENT_CLINIC_OR_DEPARTMENT_OTHER): Payer: Self-pay | Admitting: Obstetrics and Gynecology

## 2019-07-30 ENCOUNTER — Observation Stay (HOSPITAL_BASED_OUTPATIENT_CLINIC_OR_DEPARTMENT_OTHER): Payer: BC Managed Care – PPO | Admitting: Physician Assistant

## 2019-07-30 ENCOUNTER — Other Ambulatory Visit: Payer: Self-pay

## 2019-07-30 ENCOUNTER — Observation Stay (HOSPITAL_BASED_OUTPATIENT_CLINIC_OR_DEPARTMENT_OTHER): Payer: BC Managed Care – PPO | Admitting: Anesthesiology

## 2019-07-30 ENCOUNTER — Encounter (HOSPITAL_BASED_OUTPATIENT_CLINIC_OR_DEPARTMENT_OTHER)
Admission: RE | Disposition: A | Discharge status: Home / Self Care | Payer: Self-pay | Source: Home / Self Care | Attending: Obstetrics and Gynecology

## 2019-07-30 ENCOUNTER — Observation Stay (HOSPITAL_BASED_OUTPATIENT_CLINIC_OR_DEPARTMENT_OTHER)
Admission: RE | Admit: 2019-07-30 | Discharge: 2019-07-31 | Disposition: A | Discharge status: Home / Self Care | Payer: BC Managed Care – PPO | Attending: Obstetrics and Gynecology | Admitting: Obstetrics and Gynecology

## 2019-07-30 DIAGNOSIS — N393 Stress incontinence (female) (male): Secondary | ICD-10-CM | POA: Diagnosis not present

## 2019-07-30 DIAGNOSIS — E663 Overweight: Secondary | ICD-10-CM | POA: Diagnosis not present

## 2019-07-30 DIAGNOSIS — N812 Incomplete uterovaginal prolapse: Secondary | ICD-10-CM | POA: Diagnosis not present

## 2019-07-30 DIAGNOSIS — N920 Excessive and frequent menstruation with regular cycle: Secondary | ICD-10-CM | POA: Diagnosis present

## 2019-07-30 DIAGNOSIS — Z6841 Body Mass Index (BMI) 40.0 and over, adult: Secondary | ICD-10-CM | POA: Insufficient documentation

## 2019-07-30 DIAGNOSIS — R102 Pelvic and perineal pain: Secondary | ICD-10-CM | POA: Insufficient documentation

## 2019-07-30 DIAGNOSIS — Z90710 Acquired absence of both cervix and uterus: Secondary | ICD-10-CM | POA: Diagnosis present

## 2019-07-30 DIAGNOSIS — N879 Dysplasia of cervix uteri, unspecified: Secondary | ICD-10-CM | POA: Insufficient documentation

## 2019-07-30 DIAGNOSIS — K219 Gastro-esophageal reflux disease without esophagitis: Secondary | ICD-10-CM | POA: Diagnosis not present

## 2019-07-30 DIAGNOSIS — D259 Leiomyoma of uterus, unspecified: Principal | ICD-10-CM | POA: Insufficient documentation

## 2019-07-30 HISTORY — PX: ANTERIOR AND POSTERIOR REPAIR WITH SACROSPINOUS FIXATION: SHX6536

## 2019-07-30 HISTORY — PX: LAPAROSCOPIC VAGINAL HYSTERECTOMY WITH SALPINGECTOMY: SHX6680

## 2019-07-30 LAB — TYPE AND SCREEN
ABO/RH(D): O NEG
Antibody Screen: NEGATIVE

## 2019-07-30 LAB — POCT PREGNANCY, URINE: Preg Test, Ur: NEGATIVE

## 2019-07-30 SURGERY — HYSTERECTOMY, VAGINAL, LAPAROSCOPY-ASSISTED, WITH SALPINGECTOMY
Anesthesia: General | Site: Vagina

## 2019-07-30 MED ORDER — PROPOFOL 10 MG/ML IV BOLUS
INTRAVENOUS | Status: AC
  Filled 2019-07-30: qty 20

## 2019-07-30 MED ORDER — OXYCODONE-ACETAMINOPHEN 5-325 MG PO TABS
ORAL_TABLET | ORAL | Status: AC
  Filled 2019-07-30: qty 2

## 2019-07-30 MED ORDER — SCOPOLAMINE 1 MG/3DAYS TD PT72
MEDICATED_PATCH | TRANSDERMAL | Status: AC
  Filled 2019-07-30: qty 1

## 2019-07-30 MED ORDER — MIDAZOLAM HCL 5 MG/5ML IJ SOLN
INTRAMUSCULAR | Status: DC | PRN
  Administered 2019-07-30: 2 mg via INTRAVENOUS

## 2019-07-30 MED ORDER — OXYCODONE HCL 5 MG PO TABS
5.0000 mg | ORAL_TABLET | Freq: Once | ORAL | Status: DC | PRN
  Filled 2019-07-30: qty 1

## 2019-07-30 MED ORDER — OXYCODONE-ACETAMINOPHEN 5-325 MG PO TABS
ORAL_TABLET | ORAL | Status: AC
  Filled 2019-07-30: qty 1

## 2019-07-30 MED ORDER — SUGAMMADEX SODIUM 200 MG/2ML IV SOLN
INTRAVENOUS | Status: DC | PRN
  Administered 2019-07-30: 200 mg via INTRAVENOUS

## 2019-07-30 MED ORDER — ONDANSETRON HCL 4 MG/2ML IJ SOLN
4.0000 mg | Freq: Once | INTRAMUSCULAR | Status: DC | PRN
  Filled 2019-07-30: qty 2

## 2019-07-30 MED ORDER — ONDANSETRON HCL 4 MG/2ML IJ SOLN
INTRAMUSCULAR | Status: DC | PRN
  Administered 2019-07-30: 4 mg via INTRAVENOUS

## 2019-07-30 MED ORDER — OXYCODONE HCL 5 MG/5ML PO SOLN
5.0000 mg | Freq: Once | ORAL | Status: DC | PRN
  Filled 2019-07-30: qty 5

## 2019-07-30 MED ORDER — FENTANYL CITRATE (PF) 100 MCG/2ML IJ SOLN
INTRAMUSCULAR | Status: DC | PRN
  Administered 2019-07-30 (×6): 50 ug via INTRAVENOUS

## 2019-07-30 MED ORDER — ROCURONIUM BROMIDE 100 MG/10ML IV SOLN
INTRAVENOUS | Status: DC | PRN
  Administered 2019-07-30 (×2): 20 mg via INTRAVENOUS
  Administered 2019-07-30: 50 mg via INTRAVENOUS

## 2019-07-30 MED ORDER — FENTANYL CITRATE (PF) 250 MCG/5ML IJ SOLN
INTRAMUSCULAR | Status: AC
  Filled 2019-07-30: qty 5

## 2019-07-30 MED ORDER — LIDOCAINE IN D5W 4-5 MG/ML-% IV SOLN
INTRAVENOUS | Status: DC | PRN
  Administered 2019-07-30: 1.5 ug/kg/min via INTRAVENOUS

## 2019-07-30 MED ORDER — PROPOFOL 500 MG/50ML IV EMUL
INTRAVENOUS | Status: DC | PRN
  Administered 2019-07-30: 50 ug/kg/min via INTRAVENOUS

## 2019-07-30 MED ORDER — DIPHENHYDRAMINE HCL 50 MG/ML IJ SOLN
12.5000 mg | Freq: Four times a day (QID) | INTRAMUSCULAR | Status: DC | PRN
  Filled 2019-07-30: qty 0.25

## 2019-07-30 MED ORDER — LACTATED RINGERS IV SOLN
INTRAVENOUS | Status: DC
  Filled 2019-07-30: qty 1000

## 2019-07-30 MED ORDER — MEPERIDINE HCL 25 MG/ML IJ SOLN
6.2500 mg | INTRAMUSCULAR | Status: DC | PRN
  Filled 2019-07-30: qty 1

## 2019-07-30 MED ORDER — HYDROMORPHONE HCL 1 MG/ML IJ SOLN
0.2500 mg | INTRAMUSCULAR | Status: DC | PRN
  Administered 2019-07-30: 0.25 mg via INTRAVENOUS
  Filled 2019-07-30: qty 0.5

## 2019-07-30 MED ORDER — PROMETHAZINE HCL 25 MG/ML IJ SOLN
6.2500 mg | INTRAMUSCULAR | Status: DC | PRN
  Administered 2019-07-30: 11:00:00 6.25 mg via INTRAVENOUS
  Filled 2019-07-30: qty 1

## 2019-07-30 MED ORDER — SODIUM CHLORIDE 0.9% FLUSH
9.0000 mL | INTRAVENOUS | Status: DC | PRN
  Filled 2019-07-30: qty 10

## 2019-07-30 MED ORDER — ACETAMINOPHEN 10 MG/ML IV SOLN
1000.0000 mg | Freq: Once | INTRAVENOUS | Status: AC | PRN
  Administered 2019-07-30: 10:00:00 1000 mg via INTRAVENOUS
  Filled 2019-07-30: qty 100

## 2019-07-30 MED ORDER — ONDANSETRON HCL 4 MG/2ML IJ SOLN
INTRAMUSCULAR | Status: AC
  Filled 2019-07-30: qty 2

## 2019-07-30 MED ORDER — KETOROLAC TROMETHAMINE 30 MG/ML IJ SOLN
INTRAMUSCULAR | Status: AC
  Filled 2019-07-30: qty 1

## 2019-07-30 MED ORDER — HYDROMORPHONE HCL 1 MG/ML IJ SOLN
INTRAMUSCULAR | Status: AC
  Filled 2019-07-30: qty 1

## 2019-07-30 MED ORDER — SIMETHICONE 80 MG PO CHEW
80.0000 mg | CHEWABLE_TABLET | Freq: Four times a day (QID) | ORAL | Status: DC | PRN
  Filled 2019-07-30: qty 1

## 2019-07-30 MED ORDER — SODIUM CHLORIDE 0.9 % IV SOLN
INTRAVENOUS | Status: AC
  Filled 2019-07-30: qty 2

## 2019-07-30 MED ORDER — BUPIVACAINE HCL (PF) 0.25 % IJ SOLN
INTRAMUSCULAR | Status: DC | PRN
  Administered 2019-07-30: 10 mL

## 2019-07-30 MED ORDER — PROPOFOL 10 MG/ML IV BOLUS
INTRAVENOUS | Status: DC | PRN
  Administered 2019-07-30: 200 mg via INTRAVENOUS

## 2019-07-30 MED ORDER — DIPHENHYDRAMINE HCL 12.5 MG/5ML PO ELIX
12.5000 mg | ORAL_SOLUTION | Freq: Four times a day (QID) | ORAL | Status: DC | PRN
  Filled 2019-07-30: qty 5

## 2019-07-30 MED ORDER — SODIUM CHLORIDE 0.9 % IV SOLN
2.0000 g | INTRAVENOUS | Status: AC
  Administered 2019-07-30: 2 g via INTRAVENOUS
  Filled 2019-07-30: qty 2

## 2019-07-30 MED ORDER — LIDOCAINE HCL (CARDIAC) PF 100 MG/5ML IV SOSY
PREFILLED_SYRINGE | INTRAVENOUS | Status: DC | PRN
  Administered 2019-07-30: 100 mg via INTRAVENOUS

## 2019-07-30 MED ORDER — SCOPOLAMINE 1 MG/3DAYS TD PT72
1.0000 | MEDICATED_PATCH | Freq: Once | TRANSDERMAL | Status: DC
  Administered 2019-07-30: 1.5 mg via TRANSDERMAL
  Filled 2019-07-30: qty 1

## 2019-07-30 MED ORDER — OXYCODONE-ACETAMINOPHEN 5-325 MG PO TABS
1.0000 | ORAL_TABLET | ORAL | Status: DC | PRN
  Administered 2019-07-30: 22:00:00 2 via ORAL
  Administered 2019-07-30: 17:00:00 1 via ORAL
  Administered 2019-07-31 (×3): 2 via ORAL
  Filled 2019-07-30: qty 2

## 2019-07-30 MED ORDER — FENTANYL CITRATE (PF) 100 MCG/2ML IJ SOLN
INTRAMUSCULAR | Status: AC
  Filled 2019-07-30: qty 2

## 2019-07-30 MED ORDER — ESTRADIOL 0.1 MG/GM VA CREA
TOPICAL_CREAM | VAGINAL | Status: DC | PRN
  Administered 2019-07-30: 1 via VAGINAL

## 2019-07-30 MED ORDER — LIDOCAINE 2% (20 MG/ML) 5 ML SYRINGE
INTRAMUSCULAR | Status: AC
  Filled 2019-07-30: qty 5

## 2019-07-30 MED ORDER — ACETAMINOPHEN 10 MG/ML IV SOLN
INTRAVENOUS | Status: AC
  Filled 2019-07-30: qty 100

## 2019-07-30 MED ORDER — PROPOFOL 500 MG/50ML IV EMUL
INTRAVENOUS | Status: AC
  Filled 2019-07-30: qty 50

## 2019-07-30 MED ORDER — FENTANYL 40 MCG/ML IV SOLN
INTRAVENOUS | Status: DC
  Filled 2019-07-30: qty 25

## 2019-07-30 MED ORDER — NALOXONE HCL 0.4 MG/ML IJ SOLN
0.4000 mg | INTRAMUSCULAR | Status: DC | PRN
  Filled 2019-07-30: qty 1

## 2019-07-30 MED ORDER — DEXTROSE-NACL 5-0.45 % IV SOLN
INTRAVENOUS | Status: DC
  Administered 2019-07-30: 15:00:00 125 mL/h via INTRAVENOUS
  Filled 2019-07-30 (×3): qty 1000

## 2019-07-30 MED ORDER — ROCURONIUM BROMIDE 10 MG/ML (PF) SYRINGE
PREFILLED_SYRINGE | INTRAVENOUS | Status: AC
  Filled 2019-07-30: qty 10

## 2019-07-30 MED ORDER — ZOLPIDEM TARTRATE 5 MG PO TABS
5.0000 mg | ORAL_TABLET | Freq: Every evening | ORAL | Status: DC | PRN
  Filled 2019-07-30: qty 1

## 2019-07-30 MED ORDER — MIDAZOLAM HCL 2 MG/2ML IJ SOLN
INTRAMUSCULAR | Status: AC
  Filled 2019-07-30: qty 2

## 2019-07-30 MED ORDER — MENTHOL 3 MG MT LOZG
1.0000 | LOZENGE | OROMUCOSAL | Status: DC | PRN
  Filled 2019-07-30: qty 9

## 2019-07-30 MED ORDER — DEXAMETHASONE SODIUM PHOSPHATE 10 MG/ML IJ SOLN
INTRAMUSCULAR | Status: AC
  Filled 2019-07-30: qty 1

## 2019-07-30 MED ORDER — DEXAMETHASONE SODIUM PHOSPHATE 4 MG/ML IJ SOLN
INTRAMUSCULAR | Status: DC | PRN
  Administered 2019-07-30: 5 mg via INTRAVENOUS

## 2019-07-30 MED ORDER — ONDANSETRON HCL 4 MG/2ML IJ SOLN
4.0000 mg | Freq: Four times a day (QID) | INTRAMUSCULAR | Status: DC | PRN
  Administered 2019-07-30 (×2): 4 mg via INTRAVENOUS
  Filled 2019-07-30: qty 2

## 2019-07-30 MED ORDER — PROMETHAZINE HCL 25 MG/ML IJ SOLN
INTRAMUSCULAR | Status: AC
  Filled 2019-07-30: qty 1

## 2019-07-30 MED ORDER — ALUM & MAG HYDROXIDE-SIMETH 200-200-20 MG/5ML PO SUSP
30.0000 mL | ORAL | Status: DC | PRN
  Filled 2019-07-30: qty 30

## 2019-07-30 SURGICAL SUPPLY — 69 items
ADH SKN CLS APL DERMABOND .7 (GAUZE/BANDAGES/DRESSINGS) ×2
BLADE CLIPPER SENSICLIP SURGIC (BLADE) IMPLANT
BLADE SURG 15 STRL LF DISP TIS (BLADE) ×2 IMPLANT
BLADE SURG 15 STRL SS (BLADE) ×3
CABLE HIGH FREQUENCY MONO STRZ (ELECTRODE) IMPLANT
CATH ROBINSON RED A/P 16FR (CATHETERS) ×3 IMPLANT
COVER BACK TABLE 60X90IN (DRAPES) ×3 IMPLANT
COVER MAYO STAND STRL (DRAPES) ×4 IMPLANT
COVER WAND RF STERILE (DRAPES) ×3 IMPLANT
DECANTER SPIKE VIAL GLASS SM (MISCELLANEOUS) IMPLANT
DERMABOND ADVANCED (GAUZE/BANDAGES/DRESSINGS) ×1
DERMABOND ADVANCED .7 DNX12 (GAUZE/BANDAGES/DRESSINGS) ×2 IMPLANT
DEVICE CAPIO SLIM SINGLE (INSTRUMENTS) ×3 IMPLANT
DRSG COVADERM PLUS 2X2 (GAUZE/BANDAGES/DRESSINGS) ×1 IMPLANT
DRSG OPSITE POSTOP 3X4 (GAUZE/BANDAGES/DRESSINGS) ×3 IMPLANT
DURAPREP 26ML APPLICATOR (WOUND CARE) ×3 IMPLANT
ELECT LIGASURE LONG (ELECTRODE) ×3 IMPLANT
ELECT REM PT RETURN 9FT ADLT (ELECTROSURGICAL)
ELECTRODE REM PT RTRN 9FT ADLT (ELECTROSURGICAL) IMPLANT
FORCEPS CUTTING 45CM 5MM (CUTTING FORCEPS) ×1 IMPLANT
GAUZE 4X4 16PLY RFD (DISPOSABLE) ×3 IMPLANT
GAUZE PACKING 2X5 YD STRL (GAUZE/BANDAGES/DRESSINGS) ×3 IMPLANT
GAUZE SPONGE 4X4 16PLY XRAY LF (GAUZE/BANDAGES/DRESSINGS) ×1 IMPLANT
GLOVE BIO SURGEON STRL SZ8 (GLOVE) ×3 IMPLANT
GLOVE BIOGEL PI IND STRL 6.5 (GLOVE) ×4 IMPLANT
GLOVE BIOGEL PI IND STRL 7.0 (GLOVE) ×4 IMPLANT
GLOVE BIOGEL PI INDICATOR 6.5 (GLOVE) ×2
GLOVE BIOGEL PI INDICATOR 7.0 (GLOVE) ×2
GLOVE SURG ORTHO 8.0 STRL STRW (GLOVE) ×9 IMPLANT
GOWN STRL REUS W/TWL LRG LVL3 (GOWN DISPOSABLE) ×12 IMPLANT
HOLDER FOLEY CATH W/STRAP (MISCELLANEOUS) ×3 IMPLANT
KIT TURNOVER CYSTO (KITS) ×3 IMPLANT
LIGASURE IMPACT 36 18CM CVD LR (INSTRUMENTS) ×1 IMPLANT
LIGASURE LAP L-HOOKWIRE 5 44CM (INSTRUMENTS) IMPLANT
NDL MAYO 6 CRC TAPER PT (NEEDLE) ×2 IMPLANT
NEEDLE INSUFFLATION 120MM (ENDOMECHANICALS) ×5 IMPLANT
NEEDLE MAYO 6 CRC TAPER PT (NEEDLE) ×3 IMPLANT
NS IRRIG 500ML POUR BTL (IV SOLUTION) ×3 IMPLANT
PACK LAVH (CUSTOM PROCEDURE TRAY) ×3 IMPLANT
PACK TRENDGUARD 450 HYBRID PRO (MISCELLANEOUS) IMPLANT
PACK VAGINAL WOMENS (CUSTOM PROCEDURE TRAY) ×3 IMPLANT
PAD OB MATERNITY 4.3X12.25 (PERSONAL CARE ITEMS) ×3 IMPLANT
PAD PREP 24X48 CUFFED NSTRL (MISCELLANEOUS) ×3 IMPLANT
SCISSORS LAP 5X35 DISP (ENDOMECHANICALS) IMPLANT
SEALER TISSUE G2 CVD JAW 45CM (ENDOMECHANICALS) IMPLANT
SET IRRIG TUBING LAPAROSCOPIC (IRRIGATION / IRRIGATOR) IMPLANT
SET SUCTION IRRIG HYDROSURG (IRRIGATION / IRRIGATOR) ×1 IMPLANT
SOLUTION ELECTROLUBE (MISCELLANEOUS) IMPLANT
STRIP CLOSURE SKIN 1/4X4 (GAUZE/BANDAGES/DRESSINGS) IMPLANT
SUT CAPIO ETHIBPND (SUTURE) ×4 IMPLANT
SUT CAPIO PGA 48IN SZ 0 (SUTURE) IMPLANT
SUT MNCRL 0 MO-4 VIOLET 18 CR (SUTURE) ×4 IMPLANT
SUT MNCRL 0 VIOLET 6X18 (SUTURE) ×2 IMPLANT
SUT MNCRL AB 0 CT1 27 (SUTURE) IMPLANT
SUT MON AB 2-0 CT1 27 (SUTURE) ×6 IMPLANT
SUT MON AB 2-0 CT1 36 (SUTURE) ×2 IMPLANT
SUT MONOCRYL 0 6X18 (SUTURE) ×1
SUT MONOCRYL 0 MO 4 18  CR/8 (SUTURE) ×3
SUT PDS AB 0 CT1 27 (SUTURE) IMPLANT
SUT VICRYL 0 UR6 27IN ABS (SUTURE) ×3 IMPLANT
SUT VICRYL RAPIDE 3 0 (SUTURE) ×3 IMPLANT
SYR BULB IRRIGATION 50ML (SYRINGE) ×3 IMPLANT
TOWEL OR 17X26 10 PK STRL BLUE (TOWEL DISPOSABLE) ×5 IMPLANT
TRAY FOLEY W/BAG SLVR 14FR (SET/KITS/TRAYS/PACK) ×3 IMPLANT
TRENDGUARD 450 HYBRID PRO PACK (MISCELLANEOUS)
TROCAR OPTI TIP 5M 100M (ENDOMECHANICALS) ×3 IMPLANT
TROCAR XCEL DIL TIP R 11M (ENDOMECHANICALS) ×3 IMPLANT
WARMER LAPAROSCOPE (MISCELLANEOUS) ×3 IMPLANT
WATER STERILE IRR 500ML POUR (IV SOLUTION) ×3 IMPLANT

## 2019-07-30 NOTE — Transfer of Care (Signed)
Immediate Anesthesia Transfer of Care Note  Patient: Deborah Barr  Procedure(s) Performed: LAPAROSCOPIC ASSISTED VAGINAL HYSTERECTOMY WITH SALPINGECTOMY (Bilateral Abdomen) ANTERIOR AND POSTERIOR REPAIR WITH SACROSPINOUS FIXATION and kelly plication (N/A Vagina )  Patient Location: PACU  Anesthesia Type:General  Level of Consciousness: awake, alert  and oriented  Airway & Oxygen Therapy: Patient Spontanous Breathing and Patient connected to nasal cannula oxygen  Post-op Assessment: Report given to RN and Post -op Vital signs reviewed and stable  Post vital signs: Reviewed and stable  Last Vitals:  Vitals Value Taken Time  BP 123/64 07/30/19 0946  Temp    Pulse 91 07/30/19 0952  Resp 18 07/30/19 0952  SpO2 98 % 07/30/19 0952  Vitals shown include unvalidated device data.  Last Pain:  Vitals:   07/30/19 [id number]  TempSrc: Oral      Patients Stated Pain Goal: 4 ([id number])  Complications: No apparent anesthesia complications

## 2019-07-30 NOTE — Anesthesia Procedure Notes (Signed)
Procedure Name: Intubation Date/Time: 07/30/2019 7:36 AM Performed by: Bufford Spikes, CRNA Pre-anesthesia Checklist: Patient identified, Emergency Drugs available, Suction available and Patient being monitored Patient Re-evaluated:Patient Re-evaluated prior to induction Oxygen Delivery Method: Circle system utilized Preoxygenation: Pre-oxygenation with 100% oxygen Induction Type: IV induction Ventilation: Mask ventilation without difficulty Laryngoscope Size: Miller and 2 Grade View: Grade II Tube type: Oral Tube size: 7.0 mm Number of attempts: 1 Airway Equipment and Method: Stylet and Oral airway Placement Confirmation: ETT inserted through vocal cords under direct vision,  positive ETCO2 and breath sounds checked- equal and bilateral Secured at: 21 cm Tube secured with: Tape Dental Injury: Teeth and Oropharynx as per pre-operative assessment

## 2019-07-30 NOTE — Progress Notes (Signed)
Day of Surgery Procedure(s) (LRB): LAPAROSCOPIC ASSISTED VAGINAL HYSTERECTOMY WITH SALPINGECTOMY (Bilateral) ANTERIOR AND POSTERIOR REPAIR WITH SACROSPINOUS FIXATION and kelly plication (N/A)  Subjective: Patient reports tolerating PO.    Objective: I have reviewed patient's vital signs, intake and output, medications and procedure.  General: alert, cooperative and no distress  Assessment: s/p Procedure(s) with comments: LAPAROSCOPIC ASSISTED VAGINAL HYSTERECTOMY WITH SALPINGECTOMY (Bilateral) - need bed ANTERIOR AND POSTERIOR REPAIR WITH SACROSPINOUS FIXATION and kelly plication (N/A): stable  Plan: Advance diet Encourage ambulation Advance to PO medication voiding trial tomorrow  LOS: 0 days    Luz Lex 07/30/2019, 6:09 PM

## 2019-07-30 NOTE — Progress Notes (Signed)
Spoke with Dr. Corinna Capra regarding oral pain medication. Dr. Corinna Capra states he has spoken with the patient and she is able to tolerate percocet even with the codeine sensitivity recorded in medical record.

## 2019-07-30 NOTE — Anesthesia Postprocedure Evaluation (Signed)
Anesthesia Post Note  Patient: Deborah Barr  Procedure(s) Performed: LAPAROSCOPIC ASSISTED VAGINAL HYSTERECTOMY WITH SALPINGECTOMY (Bilateral Abdomen) ANTERIOR AND POSTERIOR REPAIR WITH SACROSPINOUS FIXATION and kelly plication (N/A Vagina )     Patient location during evaluation: PACU Anesthesia Type: General Level of consciousness: awake and alert Pain management: pain level controlled Vital Signs Assessment: post-procedure vital signs reviewed and stable Respiratory status: spontaneous breathing, nonlabored ventilation, respiratory function stable and patient connected to nasal cannula oxygen Cardiovascular status: blood pressure returned to baseline and stable Postop Assessment: no apparent nausea or vomiting Anesthetic complications: no    Last Vitals:  Vitals:   07/30/19 1149 07/30/19 1222  BP: (!) 99/57 (!) 103/54  Pulse: 80 76  Resp: 16 16  Temp: 36.5 C 36.6 C  SpO2: 99% 97%    Last Pain:  Vitals:   07/30/19 1222  TempSrc:   PainSc: [place]

## 2019-07-31 DIAGNOSIS — D259 Leiomyoma of uterus, unspecified: Secondary | ICD-10-CM | POA: Diagnosis not present

## 2019-07-31 LAB — SURGICAL PATHOLOGY

## 2019-07-31 LAB — CBC
HCT: 31.6 % — ABNORMAL LOW (ref 36.0–46.0)
Hemoglobin: 10 g/dL — ABNORMAL LOW (ref 12.0–15.0)
MCH: 29.4 pg (ref 26.0–34.0)
MCHC: 31.6 g/dL (ref 30.0–36.0)
MCV: 92.9 fL (ref 80.0–100.0)
Platelets: 226 10*3/uL (ref 150–400)
RBC: 3.4 MIL/uL — ABNORMAL LOW (ref 3.87–5.11)
RDW: 12.6 % (ref 11.5–15.5)
WBC: 14.2 10*3/uL — ABNORMAL HIGH (ref 4.0–10.5)
nRBC: 0 % (ref 0.0–0.2)

## 2019-07-31 MED ORDER — OXYCODONE-ACETAMINOPHEN 5-325 MG PO TABS
ORAL_TABLET | ORAL | Status: AC
  Filled 2019-07-31: qty 2

## 2019-07-31 MED ORDER — IBUPROFEN 600 MG PO TABS: 600.0000 mg | ORAL_TABLET | Freq: Four times a day (QID) | ORAL | 0 refills | Status: AC | PRN

## 2019-07-31 MED ORDER — OXYCODONE-ACETAMINOPHEN 5-325 MG PO TABS: 1.0000 | ORAL_TABLET | ORAL | 0 refills | Status: AC | PRN

## 2019-07-31 NOTE — Discharge Summary (Signed)
Physician Discharge Summary  Patient ID: AMEILIA HIRAKAWA MRN: [id number] DOB/AGE: 40-Apr-1982 40 y.o.  Admit date: 07/30/2019 Discharge date: 07/31/2019  Admission Diagnoses:AUB, menorrhagia, uterine prolapse, cystocoele, rectocoele, pelvic pain  Discharge Diagnoses: same Active Problems:   S/P laparoscopic assisted vaginal hysterectomy (LAVH)   Discharged Condition: good  Hospital Course: Underwent an uncomplicated LAVH, A&P repair and SSLS.  Her postop course was unremarkable with quick return of bowel and bladder function.  Packing removed D1 and foley and was able to void with small PVR.  Pain controlled on oral meds.  Pt desired discharge home  Consults: None  Significant Diagnostic Studies: labs: post op Hgb 10.0  Treatments: surgery:  LAVH, A&P repair and SSLS.  Discharge Exam: Blood pressure 109/61, pulse 93, temperature 98.1 F (36.7 C), resp. rate 16, height 5\' 1"  (1.549 m), weight 104.3 kg, last menstrual period 07/09/2019, SpO2 100 %. General appearance: alert, cooperative, appears stated age and no distress, Inc CD&I, nontender and BS+  Disposition: Discharge disposition: 01-Home or Self Care          Signed: Luz Lex 07/31/2019, 1:33 PM

## 2019-07-31 NOTE — Op Note (Signed)
NAME: Deborah Barr, Deborah Barr MEDICAL RECORD [id number] ACCOUNT [id number] DATE OF BIRTH:06/07/1981 FACILITY: [facility] LOCATION: [facility] PHYSICIAN:Felicitas Sine Dominic Pea, MD  OPERATIVE REPORT  DATE OF PROCEDURE:  07/30/2019  PREOPERATIVE DIAGNOSES:   1.  Abnormal uterine bleeding.  2.  Pelvic pain.  3.  Uterine prolapse.  4.  Cystocele and rectocele. 5.  Stress urinary incontinence.  POSTOPERATIVE DIAGNOSES:   1.  Abnormal uterine bleeding.  2.  Pelvic pain.  3.  Uterine prolapse.  4.  Cystocele and rectocele. 5.  Stress urinary incontinence.  PROCEDURES:   1.  Laparoscopic assisted vaginal hysterectomy. 2.  Anterior and posterior colporrhaphy.  3.  Kelly plication.  4.  Sacrospinous sacral colpopexy.  SURGEON:   Louretta Shorten, MD   ASSISTANT:  Dr. Linda Hedges.  INDICATIONS:  The patient is a 38 year old with worsening problems with menorrhagia.  She has had endometrial ablation, which worked for a while, but now she is having heavy menstrual cycles and abnormal uterine bleeding.  She also complained of pelvic  pressure and pain.  She also has uterine prolapse, complaining of pressure from the cervix noted at the introitus.  She also has a large cystocele and rectocele requiring digitalization and stress urinary incontinence when she coughs or sneezes.  She  presents for definitive surgical intervention, planned hysterectomy, planned laparoscopic-assisted vaginal approach with an anterior and posterior colporrhaphy, sacrospinous colpopexy and Kelly plication.  She declines other incontinence procedures or  referral to urology at this time.  We discussed the risks and benefits of the procedure, successive complication rates, risk of surgery, risk of anesthesia, risk of blood transfusion.  We also discussed the success and complication rates and the recovery  of the procedure at length and she gives her informed consent.    FINDINGS:   At the time of surgery,  significant uterine prolapse and  cystocele and rectocele.  Otherwise, normal appearing ovaries, appendix and liver.  DESCRIPTION OF PROCEDURE:  After adequate analgesia, the patient was placed in the dorsal lithotomy position.  She was sterilely prepped and draped.  Bladder was sterilely drained.  Graves speculum was placed.  A Cohen tenaculum was placed in the cervix.   A 1 cm infraumbilical skin incision was made and a Veress needle was inserted.  The abdomen was insufflated with dullness to percussion.  An 11 mm trocar was inserted.  Laparoscope was inserted.  The above findings were noted.  A 5 mm trocar was  inserted to the left of the midline, 2 fingerbreadths above the pubic symphysis under direct visualization.  After careful and systematic evaluation of the abdomen and pelvis, LigaSure cutting forceps were used to ligate across the left and right  uteroovarian ligaments, across the round ligament to the inferior portion of the broad ligament.  This was done bilaterally with the right and left ovary following laterally.  Good hemostasis achieved.  The bladder was then elevated and a small incision  was made at the ureterovesical junction and a bladder flap was created with good hemostasis achieved.  The abdomen was then desufflated.  Legs were repositioned.  Weighted speculum placed.  A posterior colpotomy was performed.  The cervix was then  circumscribed with Bovie cautery.  The LigaSure Impact was used to ligate and cut across the uterosacral ligaments bilaterally, cardinal ligaments and bladder pillars bilaterally.  The anterior vagina was then dissected off the cervix until the  previously made bladder flap was encountered and the anterior peritoneum easily entered and a Deaver retractor placed underneath  the bladder.  The LigaSure was then used to ligate across the uterine vasculature up to the inferior portion of the broad  ligament bilaterally, removing the uterus intact with good hemostasis achieved.  The uterosacral  ligaments were identified bilaterally and suture ligated with suture of 0 Monocryl in a figure-of-eight fashion.  The posterior peritoneum was closed in a  pursestring fashion and the posterior vagina was closed in a vertical fashion using figure-of-eights of 0 Monocryl suture with good approximation and good hemostasis achieved and the uterosacral ligaments were plicated in the midline.  The anterior vaginal mucosa was then grasped with Allis clamps.  Midline incision was made until approximately 1 cm from the urethral meatus.  The anterior vaginal mucosa was reflected laterally and the pubovesicular cervical fascia was dissected off the  anterior vaginal mucosa.  Hemostasis was achieved with Bovie cautery.  Figure-of-eights of 0 Monocryl suture were used to plicate the pubovesicular cervical fascia in the midline at the UV angle.  A suture was placed approximating the pubovesicular  cervical fascia consistent with a Kelly plication stitch with good support of the UV angle noted and the rest of the anterior vaginal mucosa appeared to have good support.  The excess vaginal mucosa was then removed and the anterior vaginal mucosa was  then closed with a 2-0 in running locking fashion Monocryl suture with good approximation and good hemostasis and good support noted.  A triangular flap was created across the perineal body and the posterior vaginal mucosa grasped and a midline incision was made to approximate 2-3 cm from the apex of the vagina.  The vaginal mucosa was then reflected laterally from the perirectal  fascia.  The right sacrospinous ligament was bluntly dissected until I was able to palpate the sacrospinous ligament.  A Capio device was placed approximately 2-3 cm from the sacrospinous process along the sacrospinous ligament with good placement noted.   Good support noted.  One passed through the sacrospinous ligament, the other end was then placed a pulley stitch at the apex of the vaginal mucosa.   The posterior perirectal fascia was then plicated in the midline using 2 figure-of-eights of 0 Monocryl  suture.  The excess vaginal mucosa was then trimmed and beginning at the apex, the vaginal mucosa was then closed using a 2-0 Monocryl suture approximately halfway down where we then tightened the Ethibond sacral colpopexy stitch, with good support of  the apex to the sacrospinous ligament.  The suture was then cut.  The posterior vaginal mucosa was then closed in its entirety.  Before closure a figure-of-eight of 0 Monocryl suture was placed across the superficial transverse perinei muscles on the  perineum with good support noted.  The 2-0 Monocryl suture was then used to close the perineal skin in a subcutaneous fashion with good approximation and good hemostasis noted.  Good support of the apex was noted, as was the posterior and anterior  vagina.  A Foley catheter was placed with clear yellow urine returned.  Vaginal packing was placed with Kerlix coated with estrogen cream.  Rectal exam revealed good rectal support and no obvious injury noted.  After re-gowning and gloving, and the legs  repositioned, the abdomen was then insufflated.  Careful examination of the abdomen and pelvis was carried out.  Suction irrigation was used in the cul-de-sac and good hemostasis was achieved.  Small peritoneal bleeders were coagulated using bipolar with  good support noted.  Ovaries appeared to be normal with minimal bleeding noted.  Peristalsis was noted of the ureters bilaterally.  No obvious bowel or bladder injury was noted.  The abdomen was then desufflated.  Trocars were removed.  The  infraumbilical skin incision was closed with a 0 figure-of-eight in the fascia and a 3-0 Vicryl Rapide subcuticular suture.  The 5 mm port was then closed with a 3-0 Vicryl Rapide subcuticular suture.  The incisions were injected with 0.25% Marcaine,  total of 10 mL used.  The patient was stable and transferred to recovery  room.  She will stay in the recovery care center tonight.  There was 500 mL of estimated blood loss due to vaginal bleeding during the procedure.  Good hemostasis was achieved.  She  did receive 2 g of cefotetan preoperatively.  Sponge and instrument count was normal at the completion of the procedure.  VN/NUANCE  D:07/30/2019 T:07/30/2019 JOB:[id number]

## 2019-07-31 NOTE — Progress Notes (Signed)
Foley and vaginal packing removed at 0600.

## 2019-11-06 ENCOUNTER — Other Ambulatory Visit: Payer: Self-pay | Admitting: Obstetrics & Gynecology

## 2020-04-09 ENCOUNTER — Other Ambulatory Visit: Payer: Self-pay | Admitting: Obstetrics & Gynecology

## 2021-11-09 ENCOUNTER — Other Ambulatory Visit: Payer: Self-pay | Admitting: Obstetrics & Gynecology

## 2022-11-09 ENCOUNTER — Other Ambulatory Visit: Payer: Self-pay | Admitting: Obstetrics & Gynecology
